# Patient Record
Sex: Female | Born: 1945 | Race: White | Hispanic: No | Marital: Married | Smoking: Former smoker
Health system: Southern US, Community
[De-identification: ages and names within clinical notes are randomized; demographics above are authoritative.]

## PROBLEM LIST (undated history)

## (undated) DIAGNOSIS — G4700 Insomnia, unspecified: Secondary | ICD-10-CM

## (undated) DIAGNOSIS — K219 Gastro-esophageal reflux disease without esophagitis: Secondary | ICD-10-CM

## (undated) DIAGNOSIS — F32A Depression, unspecified: Secondary | ICD-10-CM

## (undated) DIAGNOSIS — E785 Hyperlipidemia, unspecified: Secondary | ICD-10-CM

## (undated) DIAGNOSIS — Z8719 Personal history of other diseases of the digestive system: Secondary | ICD-10-CM

## (undated) DIAGNOSIS — C801 Malignant (primary) neoplasm, unspecified: Secondary | ICD-10-CM

## (undated) DIAGNOSIS — R519 Headache, unspecified: Secondary | ICD-10-CM

## (undated) DIAGNOSIS — M1990 Unspecified osteoarthritis, unspecified site: Secondary | ICD-10-CM

## (undated) DIAGNOSIS — R51 Headache: Secondary | ICD-10-CM

## (undated) DIAGNOSIS — R350 Frequency of micturition: Secondary | ICD-10-CM

## (undated) DIAGNOSIS — F329 Major depressive disorder, single episode, unspecified: Secondary | ICD-10-CM

## (undated) HISTORY — PX: BREAST SURGERY: SHX581

## (undated) HISTORY — PX: EYE SURGERY: SHX253

## (undated) HISTORY — PX: TONSILLECTOMY: SUR1361

## (undated) HISTORY — PX: UPPER GI ENDOSCOPY: SHX6162

## (undated) HISTORY — PX: TUBAL LIGATION: SHX77

## (undated) HISTORY — PX: COLONOSCOPY: SHX174

---

## 1995-07-14 DIAGNOSIS — C801 Malignant (primary) neoplasm, unspecified: Secondary | ICD-10-CM

## 1995-07-14 HISTORY — DX: Malignant (primary) neoplasm, unspecified: C80.1

## 1998-05-07 ENCOUNTER — Ambulatory Visit (HOSPITAL_COMMUNITY): Admission: RE | Admit: 1998-05-07 | Discharge: 1998-05-07 | Payer: Self-pay | Admitting: Obstetrics and Gynecology

## 1998-05-07 ENCOUNTER — Encounter: Payer: Self-pay | Admitting: Obstetrics and Gynecology

## 1998-07-13 HISTORY — PX: CARPAL TUNNEL RELEASE: SHX101

## 1999-05-08 ENCOUNTER — Ambulatory Visit (HOSPITAL_COMMUNITY): Admission: RE | Admit: 1999-05-08 | Discharge: 1999-05-08 | Payer: Self-pay | Admitting: Obstetrics and Gynecology

## 1999-05-12 ENCOUNTER — Ambulatory Visit (HOSPITAL_COMMUNITY): Admission: RE | Admit: 1999-05-12 | Discharge: 1999-05-12 | Payer: Self-pay | Admitting: Obstetrics and Gynecology

## 1999-05-12 ENCOUNTER — Encounter: Payer: Self-pay | Admitting: Obstetrics and Gynecology

## 1999-05-29 ENCOUNTER — Other Ambulatory Visit: Admission: RE | Admit: 1999-05-29 | Discharge: 1999-05-29 | Payer: Self-pay | Admitting: Obstetrics and Gynecology

## 1999-10-22 ENCOUNTER — Encounter: Payer: Self-pay | Admitting: Internal Medicine

## 1999-10-22 ENCOUNTER — Encounter: Admission: RE | Admit: 1999-10-22 | Discharge: 1999-10-22 | Payer: Self-pay | Admitting: Internal Medicine

## 2000-04-06 ENCOUNTER — Ambulatory Visit (HOSPITAL_COMMUNITY): Admission: RE | Admit: 2000-04-06 | Discharge: 2000-04-06 | Payer: Self-pay | Admitting: *Deleted

## 2000-05-13 ENCOUNTER — Encounter: Payer: Self-pay | Admitting: Obstetrics and Gynecology

## 2000-05-13 ENCOUNTER — Ambulatory Visit (HOSPITAL_COMMUNITY): Admission: RE | Admit: 2000-05-13 | Discharge: 2000-05-13 | Payer: Self-pay | Admitting: Obstetrics and Gynecology

## 2000-05-28 ENCOUNTER — Other Ambulatory Visit: Admission: RE | Admit: 2000-05-28 | Discharge: 2000-05-28 | Payer: Self-pay | Admitting: Obstetrics and Gynecology

## 2000-06-07 ENCOUNTER — Encounter: Admission: RE | Admit: 2000-06-07 | Discharge: 2000-06-07 | Payer: Self-pay | Admitting: Obstetrics and Gynecology

## 2000-06-07 ENCOUNTER — Encounter: Payer: Self-pay | Admitting: Obstetrics and Gynecology

## 2000-11-24 ENCOUNTER — Encounter: Admission: RE | Admit: 2000-11-24 | Discharge: 2000-11-24 | Payer: Self-pay | Admitting: Obstetrics and Gynecology

## 2000-11-24 ENCOUNTER — Encounter: Payer: Self-pay | Admitting: Obstetrics and Gynecology

## 2001-02-11 ENCOUNTER — Encounter: Admission: RE | Admit: 2001-02-11 | Discharge: 2001-02-11 | Payer: Self-pay | Admitting: Obstetrics and Gynecology

## 2001-02-11 ENCOUNTER — Encounter: Payer: Self-pay | Admitting: Obstetrics and Gynecology

## 2001-05-16 ENCOUNTER — Encounter: Payer: Self-pay | Admitting: Obstetrics and Gynecology

## 2001-05-16 ENCOUNTER — Ambulatory Visit (HOSPITAL_COMMUNITY): Admission: RE | Admit: 2001-05-16 | Discharge: 2001-05-16 | Payer: Self-pay | Admitting: Obstetrics and Gynecology

## 2001-08-02 ENCOUNTER — Other Ambulatory Visit: Admission: RE | Admit: 2001-08-02 | Discharge: 2001-08-02 | Payer: Self-pay | Admitting: Obstetrics and Gynecology

## 2001-08-15 ENCOUNTER — Encounter: Admission: RE | Admit: 2001-08-15 | Discharge: 2001-08-15 | Payer: Self-pay | Admitting: Obstetrics and Gynecology

## 2001-08-15 ENCOUNTER — Encounter: Payer: Self-pay | Admitting: Obstetrics and Gynecology

## 2002-04-13 ENCOUNTER — Encounter: Payer: Self-pay | Admitting: Obstetrics and Gynecology

## 2002-04-13 ENCOUNTER — Encounter: Admission: RE | Admit: 2002-04-13 | Discharge: 2002-04-13 | Payer: Self-pay | Admitting: Obstetrics and Gynecology

## 2002-05-18 ENCOUNTER — Ambulatory Visit (HOSPITAL_COMMUNITY): Admission: RE | Admit: 2002-05-18 | Discharge: 2002-05-18 | Payer: Self-pay | Admitting: Obstetrics and Gynecology

## 2002-05-18 ENCOUNTER — Encounter: Payer: Self-pay | Admitting: Obstetrics and Gynecology

## 2002-07-05 ENCOUNTER — Encounter: Payer: Self-pay | Admitting: Internal Medicine

## 2002-07-05 ENCOUNTER — Encounter: Admission: RE | Admit: 2002-07-05 | Discharge: 2002-07-05 | Payer: Self-pay | Admitting: Internal Medicine

## 2002-09-01 ENCOUNTER — Other Ambulatory Visit: Admission: RE | Admit: 2002-09-01 | Discharge: 2002-09-01 | Payer: Self-pay | Admitting: Obstetrics and Gynecology

## 2002-10-11 ENCOUNTER — Ambulatory Visit (HOSPITAL_COMMUNITY): Admission: RE | Admit: 2002-10-11 | Discharge: 2002-10-11 | Payer: Self-pay | Admitting: *Deleted

## 2003-05-25 ENCOUNTER — Ambulatory Visit (HOSPITAL_COMMUNITY): Admission: RE | Admit: 2003-05-25 | Discharge: 2003-05-25 | Payer: Self-pay | Admitting: Obstetrics and Gynecology

## 2003-09-26 ENCOUNTER — Other Ambulatory Visit: Admission: RE | Admit: 2003-09-26 | Discharge: 2003-09-26 | Payer: Self-pay | Admitting: Obstetrics and Gynecology

## 2004-06-02 ENCOUNTER — Ambulatory Visit (HOSPITAL_COMMUNITY): Admission: RE | Admit: 2004-06-02 | Discharge: 2004-06-02 | Payer: Self-pay | Admitting: Obstetrics and Gynecology

## 2005-10-09 ENCOUNTER — Encounter: Admission: RE | Admit: 2005-10-09 | Discharge: 2005-10-09 | Payer: Self-pay | Admitting: General Surgery

## 2010-07-13 HISTORY — PX: BACK SURGERY: SHX140

## 2015-06-25 ENCOUNTER — Other Ambulatory Visit (HOSPITAL_COMMUNITY): Payer: Self-pay | Admitting: Neurological Surgery

## 2015-07-02 ENCOUNTER — Encounter (HOSPITAL_COMMUNITY): Payer: Self-pay

## 2015-07-02 ENCOUNTER — Ambulatory Visit (HOSPITAL_COMMUNITY)
Admission: RE | Admit: 2015-07-02 | Discharge: 2015-07-02 | Disposition: A | Discharge status: Home / Self Care | Payer: BLUE CROSS/BLUE SHIELD | Source: Ambulatory Visit | Attending: Neurological Surgery | Admitting: Neurological Surgery

## 2015-07-02 ENCOUNTER — Encounter (HOSPITAL_COMMUNITY)
Admission: RE | Admit: 2015-07-02 | Discharge: 2015-07-02 | Disposition: A | Discharge status: Home / Self Care | Payer: BLUE CROSS/BLUE SHIELD | Source: Ambulatory Visit | Attending: Neurological Surgery | Admitting: Neurological Surgery

## 2015-07-02 DIAGNOSIS — R918 Other nonspecific abnormal finding of lung field: Secondary | ICD-10-CM | POA: Insufficient documentation

## 2015-07-02 DIAGNOSIS — IMO0002 Reserved for concepts with insufficient information to code with codable children: Secondary | ICD-10-CM

## 2015-07-02 DIAGNOSIS — M519 Unspecified thoracic, thoracolumbar and lumbosacral intervertebral disc disorder: Secondary | ICD-10-CM | POA: Diagnosis present

## 2015-07-02 DIAGNOSIS — Z01818 Encounter for other preprocedural examination: Secondary | ICD-10-CM | POA: Insufficient documentation

## 2015-07-02 DIAGNOSIS — Z01812 Encounter for preprocedural laboratory examination: Secondary | ICD-10-CM | POA: Diagnosis not present

## 2015-07-02 DIAGNOSIS — Z01810 Encounter for preprocedural cardiovascular examination: Secondary | ICD-10-CM | POA: Insufficient documentation

## 2015-07-02 HISTORY — DX: Headache, unspecified: R51.9

## 2015-07-02 HISTORY — DX: Unspecified osteoarthritis, unspecified site: M19.90

## 2015-07-02 HISTORY — DX: Headache: R51

## 2015-07-02 HISTORY — DX: Major depressive disorder, single episode, unspecified: F32.9

## 2015-07-02 HISTORY — DX: Depression, unspecified: F32.A

## 2015-07-02 HISTORY — DX: Personal history of other diseases of the digestive system: Z87.19

## 2015-07-02 HISTORY — DX: Malignant (primary) neoplasm, unspecified: C80.1

## 2015-07-02 HISTORY — DX: Gastro-esophageal reflux disease without esophagitis: K21.9

## 2015-07-02 LAB — BASIC METABOLIC PANEL
Anion gap: 7 (ref 5–15)
BUN: 6 mg/dL (ref 6–20)
CALCIUM: 9.4 mg/dL (ref 8.9–10.3)
CO2: 30 mmol/L (ref 22–32)
CREATININE: 0.71 mg/dL (ref 0.44–1.00)
Chloride: 107 mmol/L (ref 101–111)
GFR calc Af Amer: >60 mL/min (ref >60)
GLUCOSE: 105 mg/dL — AB (ref 65–99)
POTASSIUM: 3.6 mmol/L (ref 3.5–5.1)
SODIUM: 144 mmol/L (ref 135–145)

## 2015-07-02 LAB — CBC WITH DIFFERENTIAL/PLATELET
BASOS ABS: 0 10*3/uL (ref 0.0–0.1)
Basophils Relative: 1 %
EOS ABS: 0.1 10*3/uL (ref 0.0–0.7)
EOS PCT: 2 %
HEMATOCRIT: 37.2 % (ref 36.0–46.0)
Hemoglobin: 12.1 g/dL (ref 12.0–15.0)
LYMPHS PCT: 37 %
Lymphs Abs: 1.8 10*3/uL (ref 0.7–4.0)
MCH: 30.3 pg (ref 26.0–34.0)
MCHC: 32.5 g/dL (ref 30.0–36.0)
MCV: 93 fL (ref 78.0–100.0)
MONO ABS: 0.3 10*3/uL (ref 0.1–1.0)
Monocytes Relative: 7 %
Neutro Abs: 2.5 10*3/uL (ref 1.7–7.7)
Neutrophils Relative %: 53 %
Platelets: 211 10*3/uL (ref 150–400)
RBC: 4 MIL/uL (ref 3.87–5.11)
RDW: 13.8 % (ref 11.5–15.5)
WBC: 4.8 10*3/uL (ref 4.0–10.5)

## 2015-07-02 LAB — SURGICAL PCR SCREEN
MRSA, PCR: NEGATIVE
Staphylococcus aureus: NEGATIVE

## 2015-07-02 LAB — PROTIME-INR
INR: 0.88 (ref 0.00–1.49)
Prothrombin Time: 12.1 seconds (ref 11.6–15.2)

## 2015-07-02 NOTE — Progress Notes (Addendum)
Anesthesia Chart Review:  Pt is 69 year old female scheduled for 1 level lumbar laminectomy/ decompression microdiscectomy on 07/05/2015 with Dr. Ronnald Ramp.   PMH includes:  Breast cancer, GERD. Former smoker. BMI 18.   Medications include: lipitor, nexium, zetia, pepcid.   Preoperative labs reviewed.    Chest x-ray 07/02/15 reviewed.  1. Mild patchy bilateral pulmonary infiltrates again noted. 2. Previously identified tiny pulmonary nodules noted on prior CT of 05/27/2015 would be difficult to evaluate by plain film exam. Again follow-up CT is suggested for continued evaluation is noted on prior CT report of 05/27/2015 .  EKG 07/02/15: NSR.   Willeen Cass, FNP-BC [facility] Short Stay Surgical Center/Anesthesiology Phone: [phone] 07/02/2015 4:52 PM  Addendum:   Pt sees Dr. Loletha Carrow with pulmonology at [facility] in [facility], last office visit 05/31/15 for follow up on CT results.  He notes "Nodularity is stable and improving. There is a little patchy area that likely is related to swallowing difficulty. The likelihood of cancer is now pretty low, cannot completely rule out BAC. I don't think she needs antibiotics right now. She does have some tree-in-bud, would like to get a sputum AFB at some point, prior TB QuantiFERON Gold had a low control."   Reviewed case with Dr. Gifford Shave. Given Dr. Gaylan Gerold notes and CXR results above, will request pulmonology clearance for surgery. Notified Lorriane Shire in Dr. Ronnald Ramp' office.   Willeen Cass, FNP-BC [facility] Short Stay Surgical Center/Anesthesiology Phone: [phone] 07/03/2015 3:29 PM  Addendum: Signed note of pulmonary clearance from12/22/16 by Dr. Welford Roche received and placed on chart.  [facility] Short Stay Center/Anesthesiology Phone [phone] 07/04/2015 4:23 PM

## 2015-07-02 NOTE — Pre-Procedure Instructions (Signed)
Catherine Davidson  07/02/2015      [id number] - [place], [address] [place] [address] Phone: [phone] Fax: [phone]    Your procedure is scheduled on 07/05/2015.  Report to [facility] Admitting at 11:00 A.M.  Call this number if you have problems the morning of surgery:  [phone]   Remember:  Do not eat food or drink liquids after midnight. On Thursday   Take these medicines the morning of surgery with A SIP OF WATER :  Nexium, Effexor, (Hydrocodone is ok if needed)   Do not wear jewelry, make-up or nail polish.   Do not wear lotions, powders, or perfumes.  You may wear deodorant.   Do not shave 48 hours prior to surgery.   Do not bring valuables to the hospital.   [facility] is not responsible for any belongings or valuables.  Contacts, dentures or bridgework may not be worn into surgery.  Leave your suitcase in the car.  After surgery it may be brought to your room.  For patients admitted to the hospital, discharge time will be determined by your treatment team.  Patients discharged the day of surgery will not be allowed to drive home.   Name and phone number of your driver:   With spouse   Special instructions:  Special Instructions: [place] - Preparing for Surgery  Before surgery, you can play an important role.  Because skin is not sterile, your skin needs to be as free of germs as possible.  You can reduce the number of germs on you skin by washing with CHG (chlorahexidine gluconate) soap before surgery.  CHG is an antiseptic cleaner which kills germs and bonds with the skin to continue killing germs even after washing.  Please DO NOT use if you have an allergy to CHG or antibacterial soaps.  If your skin becomes reddened/irritated stop using the CHG and inform your nurse when you arrive at Short Stay.  Do not shave (including legs and underarms) for at least 48  hours prior to the first CHG shower.  You may shave your face.  Please follow these instructions carefully:   1.  Shower with CHG Soap the night before surgery and the  morning of Surgery.  2.  If you choose to wash your hair, wash your hair first as usual with your  normal shampoo.  3.  After you shampoo, rinse your hair and body thoroughly to remove the  Shampoo.  4.  Use CHG as you would any other liquid soap.  You can apply chg directly to the skin and wash gently with scrungie or a clean washcloth.  5.  Apply the CHG Soap to your body ONLY FROM THE NECK DOWN.    Do not use on open wounds or open sores.  Avoid contact with your eyes, ears, mouth and genitals (private parts).  Wash genitals (private parts)   with your normal soap.  6.  Wash thoroughly, paying special attention to the area where your surgery will be performed.  7.  Thoroughly rinse your body with warm water from the neck down.  8.  DO NOT shower/wash with your normal soap after using and rinsing off   the CHG Soap.  9.  Pat yourself dry with a clean towel.            10.  Wear clean  pajamas.            11.  Place clean sheets on your bed the night of your first shower and do not sleep with pets.  Day of Surgery  Do not apply any lotions/deodorants the morning of surgery.  Please wear clean clothes to the hospital/surgery center.  Please read over the following fact sheets that you were given. Pain Booklet, Coughing and Deep Breathing, MRSA Information and Surgical Site Infection Prevention

## 2015-07-02 NOTE — Progress Notes (Signed)
Pt. Followed by Dr. Carolan Shiver in [place] for neurology, he prescribes the pain meds & sleep & antidepressant.  Pt. Also sees Dr. Shana Chute for GI, states Dr. Kyra Manges in [place] follows her for PCP.  Pt. Not sure when she had her last ekg, denies any chest concerns; denies stress test, echo, cath. In the past. Faxing request to Dr. Benjamine Mola for last OV & EKG.

## 2015-07-04 MED ORDER — DEXAMETHASONE SODIUM PHOSPHATE 10 MG/ML IJ SOLN
10.0000 mg | INTRAMUSCULAR | Status: DC
  Filled 2015-07-04: qty 1

## 2015-07-04 MED ORDER — CEFAZOLIN SODIUM-DEXTROSE 2-3 GM-% IV SOLR
2.0000 g | INTRAVENOUS | Status: DC
  Filled 2015-07-04: qty 50

## 2015-07-04 NOTE — Progress Notes (Signed)
Left message for Lorriane Shire with Dr. Ronnald Ramp for update on patient's pulmonology clearance.  Called [place] [facility] pulmonology; office had no update for patient.

## 2015-07-05 ENCOUNTER — Ambulatory Visit (HOSPITAL_COMMUNITY)
Admission: RE | Admit: 2015-07-05 | Discharge: 2015-07-06 | Disposition: A | Discharge status: Home / Self Care | Payer: BLUE CROSS/BLUE SHIELD | Source: Ambulatory Visit | Attending: Neurological Surgery | Admitting: Neurological Surgery

## 2015-07-05 ENCOUNTER — Ambulatory Visit (HOSPITAL_COMMUNITY): Payer: BLUE CROSS/BLUE SHIELD

## 2015-07-05 ENCOUNTER — Encounter (HOSPITAL_COMMUNITY): Payer: Self-pay | Admitting: Neurological Surgery

## 2015-07-05 ENCOUNTER — Encounter (HOSPITAL_COMMUNITY)
Admission: RE | Disposition: A | Discharge status: Home / Self Care | Payer: Self-pay | Source: Ambulatory Visit | Attending: Neurological Surgery

## 2015-07-05 ENCOUNTER — Ambulatory Visit (HOSPITAL_COMMUNITY): Payer: BLUE CROSS/BLUE SHIELD | Admitting: Certified Registered Nurse Anesthetist

## 2015-07-05 ENCOUNTER — Ambulatory Visit (HOSPITAL_COMMUNITY): Payer: BLUE CROSS/BLUE SHIELD | Admitting: Emergency Medicine

## 2015-07-05 DIAGNOSIS — Z888 Allergy status to other drugs, medicaments and biological substances status: Secondary | ICD-10-CM | POA: Diagnosis not present

## 2015-07-05 DIAGNOSIS — Z91048 Other nonmedicinal substance allergy status: Secondary | ICD-10-CM | POA: Insufficient documentation

## 2015-07-05 DIAGNOSIS — M5126 Other intervertebral disc displacement, lumbar region: Secondary | ICD-10-CM | POA: Diagnosis not present

## 2015-07-05 DIAGNOSIS — G43909 Migraine, unspecified, not intractable, without status migrainosus: Secondary | ICD-10-CM | POA: Diagnosis not present

## 2015-07-05 DIAGNOSIS — Z882 Allergy status to sulfonamides status: Secondary | ICD-10-CM | POA: Diagnosis not present

## 2015-07-05 DIAGNOSIS — K219 Gastro-esophageal reflux disease without esophagitis: Secondary | ICD-10-CM | POA: Insufficient documentation

## 2015-07-05 DIAGNOSIS — M549 Dorsalgia, unspecified: Secondary | ICD-10-CM

## 2015-07-05 DIAGNOSIS — M4806 Spinal stenosis, lumbar region: Secondary | ICD-10-CM | POA: Insufficient documentation

## 2015-07-05 DIAGNOSIS — M545 Low back pain: Secondary | ICD-10-CM

## 2015-07-05 DIAGNOSIS — F329 Major depressive disorder, single episode, unspecified: Secondary | ICD-10-CM | POA: Diagnosis not present

## 2015-07-05 DIAGNOSIS — M19042 Primary osteoarthritis, left hand: Secondary | ICD-10-CM | POA: Insufficient documentation

## 2015-07-05 DIAGNOSIS — K449 Diaphragmatic hernia without obstruction or gangrene: Secondary | ICD-10-CM | POA: Insufficient documentation

## 2015-07-05 DIAGNOSIS — M19041 Primary osteoarthritis, right hand: Secondary | ICD-10-CM | POA: Diagnosis not present

## 2015-07-05 DIAGNOSIS — Z87891 Personal history of nicotine dependence: Secondary | ICD-10-CM | POA: Diagnosis not present

## 2015-07-05 DIAGNOSIS — Z9889 Other specified postprocedural states: Secondary | ICD-10-CM

## 2015-07-05 HISTORY — PX: LUMBAR LAMINECTOMY/DECOMPRESSION MICRODISCECTOMY: SHX5026

## 2015-07-05 SURGERY — LUMBAR LAMINECTOMY/DECOMPRESSION MICRODISCECTOMY 1 LEVEL
Anesthesia: General | Site: Back | Laterality: Right

## 2015-07-05 MED ORDER — NEOSTIGMINE METHYLSULFATE 10 MG/10ML IV SOLN
INTRAVENOUS | Status: DC | PRN
  Administered 2015-07-05: 3 mg via INTRAVENOUS

## 2015-07-05 MED ORDER — FENTANYL CITRATE (PF) 100 MCG/2ML IJ SOLN
INTRAMUSCULAR | Status: AC
  Filled 2015-07-05: qty 2

## 2015-07-05 MED ORDER — POTASSIUM CHLORIDE IN NACL 20-0.9 MEQ/L-% IV SOLN
INTRAVENOUS | Status: DC
  Administered 2015-07-06: 06:00:00 via INTRAVENOUS
  Filled 2015-07-05 (×3): qty 1000

## 2015-07-05 MED ORDER — MORPHINE SULFATE (PF) 2 MG/ML IV SOLN
1.0000 mg | INTRAVENOUS | Status: DC | PRN
  Administered 2015-07-05: 2 mg via INTRAVENOUS
  Administered 2015-07-06: 4 mg via INTRAVENOUS
  Filled 2015-07-05 (×2): qty 2

## 2015-07-05 MED ORDER — ACETAMINOPHEN 325 MG PO TABS: 650.0000 mg | ORAL_TABLET | ORAL | Status: DC | PRN

## 2015-07-05 MED ORDER — TRAMADOL HCL 50 MG PO TABS
50.0000 mg | ORAL_TABLET | Freq: Four times a day (QID) | ORAL | Status: DC | PRN
Start: 2015-07-05 — End: 2015-07-06

## 2015-07-05 MED ORDER — HEMOSTATIC AGENTS (NO CHARGE) OPTIME
TOPICAL | Status: DC | PRN
  Administered 2015-07-05: 1 via TOPICAL

## 2015-07-05 MED ORDER — SODIUM CHLORIDE 0.9 % IJ SOLN: 3.0000 mL | Freq: Two times a day (BID) | INTRAMUSCULAR | Status: DC

## 2015-07-05 MED ORDER — THROMBIN 5000 UNITS EX SOLR
CUTANEOUS | Status: DC | PRN
  Administered 2015-07-05 (×2): 5000 [IU] via TOPICAL

## 2015-07-05 MED ORDER — ROCURONIUM BROMIDE 100 MG/10ML IV SOLN
INTRAVENOUS | Status: DC | PRN
  Administered 2015-07-05: 30 mg via INTRAVENOUS

## 2015-07-05 MED ORDER — DEXAMETHASONE 4 MG PO TABS
4.0000 mg | ORAL_TABLET | Freq: Four times a day (QID) | ORAL | Status: DC
  Administered 2015-07-06: 4 mg via ORAL
  Filled 2015-07-05: qty 1

## 2015-07-05 MED ORDER — TRAZODONE HCL 100 MG PO TABS
100.0000 mg | ORAL_TABLET | Freq: Every day | ORAL | Status: DC
  Administered 2015-07-05: 100 mg via ORAL
  Filled 2015-07-05: qty 1

## 2015-07-05 MED ORDER — ACETAMINOPHEN 650 MG RE SUPP: 650.0000 mg | RECTAL | Status: DC | PRN

## 2015-07-05 MED ORDER — HYDROCODONE-ACETAMINOPHEN 5-325 MG PO TABS
1.0000 | ORAL_TABLET | ORAL | Status: DC | PRN
  Administered 2015-07-05: 2 via ORAL
  Administered 2015-07-06: 1 via ORAL
  Filled 2015-07-05 (×2): qty 2

## 2015-07-05 MED ORDER — 0.9 % SODIUM CHLORIDE (POUR BTL) OPTIME
TOPICAL | Status: DC | PRN
  Administered 2015-07-05: 1000 mL

## 2015-07-05 MED ORDER — EPHEDRINE SULFATE 50 MG/ML IJ SOLN
INTRAMUSCULAR | Status: DC | PRN
  Administered 2015-07-05: 10 mg via INTRAVENOUS

## 2015-07-05 MED ORDER — PHENOL 1.4 % MT LIQD: 1.0000 | OROMUCOSAL | Status: DC | PRN

## 2015-07-05 MED ORDER — SODIUM CHLORIDE 0.9 % IJ SOLN
3.0000 mL | INTRAMUSCULAR | Status: DC | PRN
Start: 2015-07-05 — End: 2015-07-06

## 2015-07-05 MED ORDER — ONDANSETRON HCL 4 MG/2ML IJ SOLN
4.0000 mg | INTRAMUSCULAR | Status: DC | PRN
Start: 2015-07-05 — End: 2015-07-06

## 2015-07-05 MED ORDER — ONDANSETRON HCL 4 MG/2ML IJ SOLN
4.0000 mg | Freq: Once | INTRAMUSCULAR | Status: AC | PRN
  Administered 2015-07-05: 4 mg via INTRAVENOUS

## 2015-07-05 MED ORDER — VENLAFAXINE HCL ER 75 MG PO CP24: 225.0000 mg | ORAL_CAPSULE | Freq: Every day | ORAL | Status: DC

## 2015-07-05 MED ORDER — SODIUM CHLORIDE 0.9 % IR SOLN
Status: DC | PRN
  Administered 2015-07-05: 500 mL

## 2015-07-05 MED ORDER — DEXAMETHASONE SODIUM PHOSPHATE 4 MG/ML IJ SOLN
4.0000 mg | Freq: Four times a day (QID) | INTRAMUSCULAR | Status: DC
  Administered 2015-07-05 – 2015-07-06 (×3): 4 mg via INTRAVENOUS
  Filled 2015-07-05 (×4): qty 1

## 2015-07-05 MED ORDER — TRAMADOL HCL 50 MG PO TABS: 50.0000 mg | ORAL_TABLET | Freq: Four times a day (QID) | ORAL | Status: DC | PRN

## 2015-07-05 MED ORDER — GLYCOPYRROLATE 0.2 MG/ML IJ SOLN
INTRAMUSCULAR | Status: DC | PRN
  Administered 2015-07-05: 0.4 mg via INTRAVENOUS

## 2015-07-05 MED ORDER — BUPIVACAINE HCL (PF) 0.25 % IJ SOLN
INTRAMUSCULAR | Status: DC | PRN
Start: 2015-07-05 — End: 2015-07-05
  Administered 2015-07-05: 6 mL

## 2015-07-05 MED ORDER — LIDOCAINE HCL (CARDIAC) 20 MG/ML IV SOLN
INTRAVENOUS | Status: AC
  Filled 2015-07-05: qty 5

## 2015-07-05 MED ORDER — SODIUM CHLORIDE 0.9 % IV SOLN: 250.0000 mL | INTRAVENOUS | Status: DC

## 2015-07-05 MED ORDER — PROPOFOL 10 MG/ML IV BOLUS
INTRAVENOUS | Status: DC | PRN
  Administered 2015-07-05: 100 mg via INTRAVENOUS

## 2015-07-05 MED ORDER — LACTATED RINGERS IV SOLN
INTRAVENOUS | Status: DC
  Administered 2015-07-05 (×3): via INTRAVENOUS

## 2015-07-05 MED ORDER — SENNA 8.6 MG PO TABS
1.0000 | ORAL_TABLET | Freq: Two times a day (BID) | ORAL | Status: DC
  Administered 2015-07-05 – 2015-07-06 (×2): 8.6 mg via ORAL
  Filled 2015-07-05 (×2): qty 1

## 2015-07-05 MED ORDER — MIDAZOLAM HCL 2 MG/2ML IJ SOLN
INTRAMUSCULAR | Status: AC
  Filled 2015-07-05: qty 2

## 2015-07-05 MED ORDER — MENTHOL 3 MG MT LOZG: 1.0000 | LOZENGE | OROMUCOSAL | Status: DC | PRN

## 2015-07-05 MED ORDER — FENTANYL CITRATE (PF) 250 MCG/5ML IJ SOLN
INTRAMUSCULAR | Status: AC
  Filled 2015-07-05: qty 5

## 2015-07-05 MED ORDER — PHENYLEPHRINE HCL 10 MG/ML IJ SOLN
INTRAMUSCULAR | Status: DC | PRN
  Administered 2015-07-05: 80 ug via INTRAVENOUS

## 2015-07-05 MED ORDER — TIZANIDINE HCL 2 MG PO TABS
2.0000 mg | ORAL_TABLET | Freq: Three times a day (TID) | ORAL | Status: DC | PRN
  Filled 2015-07-05: qty 1

## 2015-07-05 MED ORDER — LUBIPROSTONE 24 MCG PO CAPS
24.0000 ug | ORAL_CAPSULE | Freq: Two times a day (BID) | ORAL | Status: DC
  Administered 2015-07-06: 24 ug via ORAL
  Filled 2015-07-05: qty 1

## 2015-07-05 MED ORDER — FENTANYL CITRATE (PF) 100 MCG/2ML IJ SOLN
25.0000 ug | INTRAMUSCULAR | Status: DC | PRN
  Administered 2015-07-05: 50 ug via INTRAVENOUS
  Administered 2015-07-05 (×2): 25 ug via INTRAVENOUS

## 2015-07-05 MED ORDER — CEFAZOLIN SODIUM 1-5 GM-% IV SOLN
1.0000 g | Freq: Three times a day (TID) | INTRAVENOUS | Status: AC
  Administered 2015-07-06 (×2): 1 g via INTRAVENOUS
  Filled 2015-07-05 (×3): qty 50

## 2015-07-05 MED ORDER — LIDOCAINE HCL (CARDIAC) 20 MG/ML IV SOLN
INTRAVENOUS | Status: DC | PRN
  Administered 2015-07-05: 60 mg via INTRAVENOUS

## 2015-07-05 MED ORDER — MIDAZOLAM HCL 5 MG/5ML IJ SOLN
INTRAMUSCULAR | Status: DC | PRN
  Administered 2015-07-05 (×2): 1 mg via INTRAVENOUS

## 2015-07-05 MED ORDER — VENLAFAXINE HCL ER 75 MG PO CP24
150.0000 mg | ORAL_CAPSULE | Freq: Three times a day (TID) | ORAL | Status: DC
  Administered 2015-07-05 – 2015-07-06 (×2): 150 mg via ORAL
  Filled 2015-07-05 (×2): qty 2

## 2015-07-05 MED ORDER — FENTANYL CITRATE (PF) 100 MCG/2ML IJ SOLN
INTRAMUSCULAR | Status: DC | PRN
  Administered 2015-07-05 (×3): 50 ug via INTRAVENOUS

## 2015-07-05 SURGICAL SUPPLY — 38 items
BAG DECANTER FOR FLEXI CONT (MISCELLANEOUS) ×3 IMPLANT
BENZOIN TINCTURE PRP APPL 2/3 (GAUZE/BANDAGES/DRESSINGS) ×3 IMPLANT
BUR MATCHSTICK NEURO 3.0 LAGG (BURR) ×3 IMPLANT
CANISTER SUCT 3000ML PPV (MISCELLANEOUS) ×3 IMPLANT
CLOSURE WOUND 1/2 X4 (GAUZE/BANDAGES/DRESSINGS) ×1
DRAPE LAPAROTOMY 100X72X124 (DRAPES) ×3 IMPLANT
DRAPE MICROSCOPE LEICA (MISCELLANEOUS) ×3 IMPLANT
DRAPE POUCH INSTRU U-SHP 10X18 (DRAPES) ×3 IMPLANT
DRAPE SURG 17X23 STRL (DRAPES) ×3 IMPLANT
DRSG OPSITE POSTOP 3X4 (GAUZE/BANDAGES/DRESSINGS) ×3 IMPLANT
DURAPREP 26ML APPLICATOR (WOUND CARE) ×3 IMPLANT
ELECT REM PT RETURN 9FT ADLT (ELECTROSURGICAL) ×3
ELECTRODE REM PT RTRN 9FT ADLT (ELECTROSURGICAL) ×1 IMPLANT
GAUZE SPONGE 4X4 16PLY XRAY LF (GAUZE/BANDAGES/DRESSINGS) IMPLANT
GLOVE BIO SURGEON STRL SZ8 (GLOVE) ×3 IMPLANT
GOWN STRL REUS W/ TWL LRG LVL3 (GOWN DISPOSABLE) IMPLANT
GOWN STRL REUS W/ TWL XL LVL3 (GOWN DISPOSABLE) ×1 IMPLANT
GOWN STRL REUS W/TWL 2XL LVL3 (GOWN DISPOSABLE) IMPLANT
GOWN STRL REUS W/TWL LRG LVL3 (GOWN DISPOSABLE)
GOWN STRL REUS W/TWL XL LVL3 (GOWN DISPOSABLE) ×2
HEMOSTAT POWDER KIT SURGIFOAM (HEMOSTASIS) IMPLANT
KIT BASIN OR (CUSTOM PROCEDURE TRAY) ×3 IMPLANT
KIT ROOM TURNOVER OR (KITS) ×3 IMPLANT
NEEDLE HYPO 25X1 1.5 SAFETY (NEEDLE) ×3 IMPLANT
NEEDLE SPNL 20GX3.5 QUINCKE YW (NEEDLE) IMPLANT
NS IRRIG 1000ML POUR BTL (IV SOLUTION) ×3 IMPLANT
PACK LAMINECTOMY NEURO (CUSTOM PROCEDURE TRAY) ×3 IMPLANT
PAD ARMBOARD 7.5X6 YLW CONV (MISCELLANEOUS) ×9 IMPLANT
RUBBERBAND STERILE (MISCELLANEOUS) ×6 IMPLANT
SPONGE SURGIFOAM ABS GEL SZ50 (HEMOSTASIS) ×3 IMPLANT
STRIP CLOSURE SKIN 1/2X4 (GAUZE/BANDAGES/DRESSINGS) ×2 IMPLANT
SUT VIC AB 0 CT1 18XCR BRD8 (SUTURE) ×1 IMPLANT
SUT VIC AB 0 CT1 8-18 (SUTURE) ×2
SUT VIC AB 2-0 CP2 18 (SUTURE) ×3 IMPLANT
SUT VIC AB 3-0 SH 8-18 (SUTURE) ×3 IMPLANT
TOWEL OR 17X24 6PK STRL BLUE (TOWEL DISPOSABLE) ×3 IMPLANT
TOWEL OR 17X26 10 PK STRL BLUE (TOWEL DISPOSABLE) ×3 IMPLANT
WATER STERILE IRR 1000ML POUR (IV SOLUTION) ×3 IMPLANT

## 2015-07-05 NOTE — Progress Notes (Signed)
Patient ID: Catherine Davidson, female   DOB: January 08, 1946, 69 y.o.   MRN: [id number] Doing well, back sore, no leg pain

## 2015-07-05 NOTE — Anesthesia Procedure Notes (Signed)
Procedure Name: Intubation Date/Time: 07/05/2015 2:05 PM Performed by: Rebekah Chesterfield L Pre-anesthesia Checklist: Patient identified, Emergency Drugs available, Suction available, Timeout performed and Patient being monitored Patient Re-evaluated:Patient Re-evaluated prior to inductionOxygen Delivery Method: Circle system utilized Preoxygenation: Pre-oxygenation with 100% oxygen Intubation Type: IV induction Ventilation: Mask ventilation without difficulty Laryngoscope Size: Mac and 3 Grade View: Grade II Tube type: Oral Tube size: 7.0 mm Number of attempts: 1 Airway Equipment and Method: Stylet Placement Confirmation: ETT inserted through vocal cords under direct vision,  breath sounds checked- equal and bilateral and positive ETCO2 Secured at: 20 cm Tube secured with: Tape Dental Injury: Teeth and Oropharynx as per pre-operative assessment

## 2015-07-05 NOTE — H&P (Signed)
Subjective: Patient is a 69 y.o. female admitted for HNP. Onset of symptoms was several months ago, gradually worsening since that time.  The pain is rated severe, and is located at the across the lower back and radiates to R hip. The pain is described as aching and occurs all day. The symptoms have been progressive. Symptoms are exacerbated by exercise. MRI or CT showed HNP/ stenosis L1-2   Past Medical History  Diagnosis Date  . Depression   . GERD (gastroesophageal reflux disease)   . History of hiatal hernia   . Headache     h/o migraines   . Arthritis     lumbar- HNP, OA- hands, & "all of my back"  . Cancer ([place]) 1997    Breast - L- lumpectomy , treated /w radiation    Past Surgical History  Procedure Laterality Date  . Upper gi endoscopy      to stretch esophagus   . Breast surgery Left     lumpectomy  . Back surgery  2012    [facility]- Dr. Kela Millin   . Carpal tunnel release Right 2000  . Tonsillectomy    . Tubal ligation    . Eye surgery Bilateral     cataracts removed    Prior to Admission medications   Medication Sig Start Date End Date Taking? Authorizing Provider  atorvastatin (LIPITOR) 10 MG tablet Take 10 mg by mouth at bedtime.    Yes Historical Provider, MD  b complex vitamins tablet Take 1 tablet by mouth daily.   Yes Historical Provider, MD  Cholecalciferol (VITAMIN D3) 5000 UNITS TABS Take 1 tablet by mouth daily.   Yes Historical Provider, MD  docusate sodium (COLACE) 100 MG capsule Take 100 mg by mouth 3 (three) times daily as needed for mild constipation.   Yes Historical Provider, MD  esomeprazole (NEXIUM) 40 MG capsule Take 40 mg by mouth 2 (two) times daily before a meal.   Yes Historical Provider, MD  ezetimibe (ZETIA) 10 MG tablet Take 10 mg by mouth at bedtime.    Yes Historical Provider, MD  famotidine-calcium carbonate-magnesium hydroxide (PEPCID COMPLETE) 10-800-165 MG chewable tablet Chew 1 tablet by mouth daily as needed.   Yes Historical Provider, MD   HYDROcodone-acetaminophen (NORCO) 10-325 MG tablet Take 2 tablets by mouth every 8 (eight) hours.    Yes Historical Provider, MD  isometheptene-acetaminophen-dichloralphenazone (MIDRIN) 65-100-325 MG capsule Take 1 capsule by mouth 4 (four) times daily as needed for migraine. Maximum 5 capsules in 12 hours for migraine headaches, 8 capsules in 24 hours for tension headaches.   Yes Historical Provider, MD  lidocaine (LIDODERM) 5 % Place 1 patch onto the skin daily as needed. Remove & Discard patch within 12 hours or as directed by MD   Yes Historical Provider, MD  lubiprostone (AMITIZA) 24 MCG capsule Take 24 mcg by mouth 2 (two) times daily with a meal.   Yes Historical Provider, MD  ondansetron (ZOFRAN-ODT) 4 MG disintegrating tablet Take 1-2 tablets by mouth every 8 (eight) hours as needed. 04/17/15  Yes Historical Provider, MD  polyethylene glycol (MIRALAX / GLYCOLAX) packet Take 17-34 g by mouth daily.   Yes Historical Provider, MD  sennosides-docusate sodium (SENOKOT-S) 8.6-50 MG tablet Take 2-4 tablets by mouth daily.   Yes Historical Provider, MD  tiZANidine (ZANAFLEX) 2 MG tablet Take 2 mg by mouth every 8 (eight) hours as needed for muscle spasms.   Yes Historical Provider, MD  traZODone (DESYREL) 100 MG tablet Take 100 mg  by mouth at bedtime.   Yes Historical Provider, MD  venlafaxine XR (EFFEXOR-XR) 150 MG 24 hr capsule Take 450 mg by mouth at bedtime.    Yes Historical Provider, MD  traMADol (ULTRAM) 50 MG tablet Take 50 mg by mouth every 6 (six) hours as needed for moderate pain.    Historical Provider, MD   Allergies  Allergen Reactions  . Nickel Other (See Comments)    Turns skin black  . Sulfa Antibiotics Hives  . Nitrofuran Derivatives Hives    Social History  Substance Use Topics  . Smoking status: Former Research scientist (life sciences)  . Smokeless tobacco: Former Systems developer    Quit date: 07/01/1994  . Alcohol Use: No    History reviewed. No pertinent family history.   Review of Systems  Positive  ROS: neg  All other systems have been reviewed and were otherwise negative with the exception of those mentioned in the HPI and as above.  Objective: Vital signs in last 24 hours: Temp:  [98.6 F (37 C)] 98.6 F (37 C) (12/23 1108) Pulse Rate:  [106] 106 (12/23 1108) Resp:  [16] 16 (12/23 1108) BP: (121)/(81) 121/81 mmHg (12/23 1108) SpO2:  [99 %] 99 % (12/23 1108) Weight:  [43.545 kg (96 lb)] 43.545 kg (96 lb) (12/23 1108)  General Appearance: Alert, cooperative, no distress, appears stated age Head: Normocephalic, without obvious abnormality, atraumatic Eyes: PERRL, conjunctiva/corneas clear, EOM's intact    Neck: Supple, symmetrical, trachea midline Back: Symmetric, no curvature, ROM normal, no CVA tenderness Lungs:  respirations unlabored Heart: Regular rate and rhythm Abdomen: Soft, non-tender Extremities: Extremities normal, atraumatic, no cyanosis or edema Pulses: 2+ and symmetric all extremities Skin: Skin color, texture, turgor normal, no rashes or lesions  NEUROLOGIC:   Mental status: Alert and oriented x4,  no aphasia, good attention span, fund of knowledge, and memory Motor Exam - grossly normal Sensory Exam - grossly normal Reflexes: 1= Coordination - grossly normal Gait - grossly normal Balance - grossly normal Cranial Nerves: I: smell Not tested  II: visual acuity  OS: nl    OD: nl  II: visual fields Full to confrontation  II: pupils Equal, round, reactive to light  III,VII: ptosis None  III,IV,VI: extraocular muscles  Full ROM  V: mastication Normal  V: facial light touch sensation  Normal  V,VII: corneal reflex  Present  VII: facial muscle function - upper  Normal  VII: facial muscle function - lower Normal  VIII: hearing Not tested  IX: soft palate elevation  Normal  IX,X: gag reflex Present  XI: trapezius strength  5/5  XI: sternocleidomastoid strength 5/5  XI: neck flexion strength  5/5  XII: tongue strength  Normal    Data Review Lab  Results  Component Value Date   WBC 4.8 07/02/2015   HGB 12.1 07/02/2015   HCT 37.2 07/02/2015   MCV 93.0 07/02/2015   PLT 211 07/02/2015   Lab Results  Component Value Date   NA 144 07/02/2015   K 3.6 07/02/2015   CL 107 07/02/2015   CO2 30 07/02/2015   BUN 6 07/02/2015   CREATININE 0.71 07/02/2015   GLUCOSE 105* 07/02/2015   Lab Results  Component Value Date   INR 0.88 07/02/2015    Assessment/Plan: Patient admitted for R L1-2 LL. Patient has failed a reasonable attempt at conservative therapy.  I explained the condition and procedure to the patient and answered any questions.  Patient wishes to proceed with procedure as planned. Understands risks/ benefits and typical  outcomes of procedure.   Khaliah Barnick S 07/05/2015 1:41 PM

## 2015-07-05 NOTE — Anesthesia Postprocedure Evaluation (Signed)
Anesthesia Post Note  Patient: Catherine Davidson  Procedure(s) Performed: Procedure(s) (LRB): LUMBAR LAMINECTOMY/DECOMPRESSION MICRODISCECTOMY 1 LEVEL (Right)  Patient location during evaluation: PACU Anesthesia Type: General Level of consciousness: awake and alert Pain management: pain level controlled Vital Signs Assessment: post-procedure vital signs reviewed and stable Respiratory status: spontaneous breathing, nonlabored ventilation, respiratory function stable and patient connected to nasal cannula oxygen Cardiovascular status: blood pressure returned to baseline and stable Postop Assessment: no signs of nausea or vomiting Anesthetic complications: no    Last Vitals:  Filed Vitals:   07/05/15 1630 07/05/15 1633  BP:    Pulse: 87 74  Temp:  37 C  Resp: 17 15    Last Pain:  Filed Vitals:   07/05/15 1635  PainSc: Asleep                 Hadriel Northup,W. EDMOND

## 2015-07-05 NOTE — Transfer of Care (Signed)
Immediate Anesthesia Transfer of Care Note  Patient: Catherine Davidson  Procedure(s) Performed: Procedure(s) with comments: LUMBAR LAMINECTOMY/DECOMPRESSION MICRODISCECTOMY 1 LEVEL (Right) - LUMBAR LAMINECTOMY/DECOMPRESSION MICRODISCECTOMY 1 LEVEL  Patient Location: PACU  Anesthesia Type:General  Level of Consciousness: awake, alert , oriented and patient cooperative  Airway & Oxygen Therapy: Patient Spontanous Breathing and Patient connected to nasal cannula oxygen  Post-op Assessment: Report given to RN, Post -op Vital signs reviewed and stable and Patient moving all extremities  Post vital signs: Reviewed and stable  Last Vitals:  Filed Vitals:   07/05/15 1108  BP: 121/81  Pulse: 106  Temp: 37 C  Resp: 16    Complications: No apparent anesthesia complications

## 2015-07-05 NOTE — Anesthesia Preprocedure Evaluation (Signed)
Anesthesia Evaluation  Patient identified by MRN, date of birth, ID band Patient awake    Reviewed: Allergy & Precautions, NPO status , Patient's Chart, lab work & pertinent test results  Airway Mallampati: II  TM Distance: >3 FB Neck ROM: Full    Dental  (+) Teeth Intact, Dental Advisory Given   Pulmonary former smoker,    Pulmonary exam normal breath sounds clear to auscultation       Cardiovascular Exercise Tolerance: Good (-) hypertension(-) angina(-) CAD and (-) Past MI negative cardio ROS Normal cardiovascular exam Rhythm:Regular Rate:Normal     Neuro/Psych  Headaches, PSYCHIATRIC DISORDERS Depression    GI/Hepatic Neg liver ROS, hiatal hernia, GERD  Medicated,  Endo/Other  negative endocrine ROS  Renal/GU negative Renal ROS     Musculoskeletal  (+) Arthritis , Osteoarthritis,    Abdominal   Peds  Hematology negative hematology ROS (+)   Anesthesia Other Findings Day of surgery medications reviewed with the patient.  Breast ca s/p left breast lumpectomy  Reproductive/Obstetrics                             Anesthesia Physical  Anesthesia Plan  ASA: II  Anesthesia Plan: General   Post-op Pain Management:    Induction: Intravenous  Airway Management Planned: Oral ETT  Additional Equipment:   Intra-op Plan:   Post-operative Plan: Extubation in OR  Informed Consent: I have reviewed the patients History and Physical, chart, labs and discussed the procedure including the risks, benefits and alternatives for the proposed anesthesia with the patient or authorized representative who has indicated his/her understanding and acceptance.   Dental advisory given  Plan Discussed with: CRNA  Anesthesia Plan Comments: (Risks/benefits of general anesthesia discussed with patient including risk of damage to teeth, lips, gum, and tongue, nausea/vomiting, allergic reactions to  medications, and the possibility of heart attack, stroke and death.  All patient questions answered.  Patient wishes to proceed.)        Anesthesia Quick Evaluation  

## 2015-07-05 NOTE — Op Note (Signed)
07/05/2015  3:20 PM  PATIENT:  Catherine Davidson  69 y.o. 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 She had an MRI which showed scoliosis with severe foraminal stenosis at L1-2 on the right with a right foraminal disc herniation. She tried medical management without relief. I recommended a right L1-2 extra foraminal microdiscectomy. Patient understood the risks, benefits, and alternatives and potential outcomes and wished to proceed.  PROCEDURE DETAILS: The patient was taken to the operating room and after induction of adequate generalized endotracheal anesthesia, the patient was rolled into the prone position on the Wilson frame and all pressure points were padded. The lumbar region was cleaned and then prepped with DuraPrep and draped in the usual sterile fashion. 5 cc of local anesthesia was injected and then a dorsal midline incision was made and carried down to the lumbo sacral fascia. The fascia was opened and the paraspinous musculature was taken down in a subperiosteal fashion to expose L12 on the right. Intraoperative x-ray confirmed my level, and then I used a combination of the high-speed drill and the Kerrison punches to perform an extraforaminal decompression at L1 2 on the right. I drilled away the superior part of the facet as well as a little bit of the lateral part of the pars. There was significant compression from the superior facet of L2. The underlying yellow ligament was opened and removed in a piecemeal fashion to expose the underlying  dura and exiting nerve root. I coagulated the epidural venous vasculature, and incised the disc space. I performed a thorough intradiscal discectomy with pituitary rongeurs and curettes, until I had a nice decompression of the nerve root and the midline. I then palpated with a coronary dilator along the nerve root and into the foramen to assure adequate decompression. I felt no more compression of the nerve root. I irrigated with saline solution containing bacitracin. Achieved hemostasis with bipolar cautery, lined the dura with Gelfoam, and then closed the fascia with 0 Vicryl. I closed the subcutaneous tissues with 2-0 Vicryl and the subcuticular tissues with 3-0 Vicryl. The skin was then closed with benzoin and Steri-Strips. The drapes were removed, a sterile dressing was applied. The patient was awakened from general anesthesia and transferred to the recovery room in stable condition. At the end of the procedure all sponge, needle and instrument counts were correct.   PLAN OF CARE: Admit for overnight observation  PATIENT DISPOSITION:  PACU - hemodynamically stable.   Delay start of Pharmacological VTE agent (>24hrs) due to surgical blood loss or risk of bleeding:  yes

## 2015-07-06 DIAGNOSIS — M5126 Other intervertebral disc displacement, lumbar region: Secondary | ICD-10-CM | POA: Diagnosis not present

## 2015-07-06 MED ORDER — ONDANSETRON 4 MG PO TBDP
4.0000 mg | ORAL_TABLET | Freq: Once | ORAL | Status: AC
  Administered 2015-07-06: 4 mg via ORAL
  Filled 2015-07-06: qty 1

## 2015-07-06 MED ORDER — OXYCODONE-ACETAMINOPHEN 5-325 MG PO TABS: 1.0000 | ORAL_TABLET | ORAL | Status: DC | PRN

## 2015-07-06 NOTE — Progress Notes (Signed)
Received from PACU via stretcher; oriented patient to room and unit routine.

## 2015-07-06 NOTE — Progress Notes (Signed)
Pt discharging at this time taking all personal belongings. IV discontinued, dry dressing applied. Surgical site dry and intact. Discharge instructions with prescription provided with verbal understanding. No noted distress.

## 2015-07-06 NOTE — Progress Notes (Signed)
Pt ambulated with rolling walker in the hallway stand by assist. Gait steady. No noted distress. Will continue to monitor.

## 2015-07-06 NOTE — Discharge Summary (Signed)
Physician Discharge Summary  Patient ID: Catherine Davidson MRN: [id number] DOB/AGE: 67-Jun-1947 67 y.o.  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 Ronnald Ramp who performed a lumbar discectomy. Postoperatively she is up and ambulating. She is asking to be discharged to home. She has been given instructions regarding wound care and activities. She is scheduled to follow-up with Dr. Ronnald Ramp in about a week and a half.  Discharge Exam: Blood pressure 109/58, pulse 78, temperature 98 F (36.7 C), temperature source Oral, resp. rate 20, height 5\' 1"  (1.549 m), weight 43.545 kg (96 lb), SpO2 100 %.  Disposition: Home    Medication List    TAKE these medications        atorvastatin 10 MG tablet  Commonly known as:  LIPITOR  Take 10 mg by mouth at bedtime.     b complex vitamins tablet  Take 1 tablet by mouth daily.     docusate sodium 100 MG capsule  Commonly known as:  COLACE  Take 100 mg by mouth 3 (three) times daily as needed for mild constipation.     esomeprazole 40 MG capsule  Commonly known as:  NEXIUM  Take 40 mg by mouth 2 (two) times daily before a meal.     ezetimibe 10 MG tablet  Commonly known as:  ZETIA  Take 10 mg by mouth at bedtime.     famotidine-calcium carbonate-magnesium hydroxide 10-800-165 MG chewable tablet  Commonly known as:  PEPCID COMPLETE  Chew 1 tablet by mouth daily as needed.     HYDROcodone-acetaminophen 10-325 MG tablet  Commonly known as:  NORCO  Take 2 tablets by mouth every 8 (eight) hours.     isometheptene-acetaminophen-dichloralphenazone 65-100-325 MG capsule  Commonly known as:  MIDRIN  Take 1 capsule by mouth 4 (four) times daily as needed for migraine. Maximum 5 capsules  in 12 hours for migraine headaches, 8 capsules in 24 hours for tension headaches.     lidocaine 5 %  Commonly known as:  LIDODERM  Place 1 patch onto the skin daily as needed. Remove & Discard patch within 12 hours or as directed by MD     lubiprostone 24 MCG capsule  Commonly known as:  AMITIZA  Take 24 mcg by mouth 2 (two) times daily with a meal.     ondansetron 4 MG disintegrating tablet  Commonly known as:  ZOFRAN-ODT  Take 1-2 tablets by mouth every 8 (eight) hours as needed.     oxyCODONE-acetaminophen 5-325 MG tablet  Commonly known as:  ROXICET  Take 1-2 tablets by mouth every 4 (four) hours as needed (pain).     polyethylene glycol packet  Commonly known as:  MIRALAX / GLYCOLAX  Take 17-34 g by mouth daily.     sennosides-docusate sodium 8.6-50 MG tablet  Commonly known as:  SENOKOT-S  Take 2-4 tablets by mouth daily.     tiZANidine 2 MG tablet  Commonly known as:  ZANAFLEX  Take 2 mg by mouth every 8 (eight) hours as needed for muscle spasms.     traMADol 50 MG tablet  Commonly known as:  ULTRAM  Take 50 mg by mouth every 6 (six) hours as needed for moderate pain.     traZODone 100 MG tablet  Commonly known as:  DESYREL  Take 100 mg by mouth at bedtime.     venlafaxine XR 150 MG 24 hr capsule  Commonly known as:  EFFEXOR-XR  Take 450 mg by mouth at bedtime. Pt states she takes 450 mg qHS, but Rx written as 150 mg TID     Vitamin D3 5000 UNITS Tabs  Take 1 tablet by mouth daily.         SignedHosie Spangle 07/06/2015, 12:44 PM

## 2015-07-09 ENCOUNTER — Encounter (HOSPITAL_COMMUNITY): Payer: Self-pay | Admitting: Neurological Surgery

## 2016-03-30 ENCOUNTER — Other Ambulatory Visit: Payer: Self-pay | Admitting: Neurological Surgery

## 2016-04-20 ENCOUNTER — Encounter (HOSPITAL_COMMUNITY)
Admission: RE | Admit: 2016-04-20 | Discharge: 2016-04-20 | Disposition: A | Discharge status: Home / Self Care | Payer: BLUE CROSS/BLUE SHIELD | Source: Ambulatory Visit | Attending: Neurological Surgery | Admitting: Neurological Surgery

## 2016-04-20 ENCOUNTER — Other Ambulatory Visit (HOSPITAL_COMMUNITY): Payer: Self-pay | Admitting: *Deleted

## 2016-04-20 ENCOUNTER — Encounter (HOSPITAL_COMMUNITY): Payer: Self-pay

## 2016-04-20 DIAGNOSIS — Z01812 Encounter for preprocedural laboratory examination: Secondary | ICD-10-CM | POA: Diagnosis present

## 2016-04-20 HISTORY — DX: Frequency of micturition: R35.0

## 2016-04-20 HISTORY — DX: Hyperlipidemia, unspecified: E78.5

## 2016-04-20 HISTORY — DX: Insomnia, unspecified: G47.00

## 2016-04-20 LAB — CBC
HCT: 34.2 % — ABNORMAL LOW (ref 36.0–46.0)
HEMOGLOBIN: 10.6 g/dL — AB (ref 12.0–15.0)
MCH: 28.8 pg (ref 26.0–34.0)
MCHC: 31 g/dL (ref 30.0–36.0)
MCV: 92.9 fL (ref 78.0–100.0)
Platelets: 278 10*3/uL (ref 150–400)
RBC: 3.68 MIL/uL — ABNORMAL LOW (ref 3.87–5.11)
RDW: 13.1 % (ref 11.5–15.5)
WBC: 6.7 10*3/uL (ref 4.0–10.5)

## 2016-04-20 LAB — SURGICAL PCR SCREEN
MRSA, PCR: NEGATIVE
STAPHYLOCOCCUS AUREUS: NEGATIVE

## 2016-04-20 NOTE — Pre-Procedure Instructions (Signed)
    Catherine Davidson  04/20/2016      [phone] - [place], [address] [place] [address] Phone: [phone] Fax: [phone]    Your procedure is scheduled on 04-24-2016   Friday   Report to [facility] Admitting at 7:45 A.M.   Call this number if you have problems the morning of surgery:  [phone]   Remember:  Do not eat food or drink liquids after midnight.   Take these medicines the morning of surgery with A SIP OF WATER Nexium,Pain medication if needed, Tizanidine(Zanaflex)                STOP ASPIRIN,ANTIINFLAMATORIES (IBUPROFEN,ALEVE,MOTRIN,ADVIL,GOODY'S POWDERS),HERBAL SUPPLEMENTS,FISH OIL,AND VITAMINS 5-7 DAYS PRIOR TO SURGERY   Do not wear jewelry, make-up or nail polish.  Do not wear lotions, powders, or perfumes, or deoderant.  Do not shave 48 hours prior to surgery.     Do not bring valuables to the hospital.  [facility] is not responsible for any belongings or valuables.  Contacts, dentures or bridgework may not be worn into surgery.  Leave your suitcase in the car.  After surgery it may be brought to your room.  For patients admitted to the hospital, discharge time will be determined by your treatment team.  Patients discharged the day of surgery will not be allowed to drive home.    Special instructions:  See attached Sheet for instructions on CHG showers  .

## 2016-04-21 NOTE — Progress Notes (Signed)
Anesthesia Chart Review: Patient is a 70 year old female scheduled for spinal cord stimulator trial on 04/24/16 by Dr. Ronnald Ramp.   PMH includes former smoker, breast cancer s/p left lumpectomy/radiation, hyperlipidemia, GERD, hiatal hernia, migraines, arthritis, insomnia, L1-2 microdiscectomy 07/05/15.   - PCP is Dr. Thalia Bloodgood.  - Pulmonologist is Dr. Loletha Carrow with [facility], last visit 08/16/15. He reviewed her 08/14/15 chest CT results and wrote, "She has had RLL infilt and nodularity which is much improved on current CT. 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 I don't think she needs Abxs. See Dr. Shana Chute about her severe reflux and other GI issues."  Medications include Lipitor, Nexium, Pepcid, Prozac, Norco, Lidoderm, Remeron, Zofran, Zanaflex, trazodone.  BP (!) 116/47   Pulse 66   Temp 36.7 C   Resp 18   Ht 5' 1.5" (1.562 m)   Wt 96 lb 6.4 oz (43.7 kg)   SpO2 98%   BMI 17.92 kg/m   EKG 07/02/15: NSR.   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 No evidence of lymphadenopathy or pleural effusion.  CXR 07/02/15: Impression:  1. Mild patchy bilateral pulmonary infiltrates again noted. 2. Previously identified tiny pulmonary nodules noted on prior CT of 05/27/2015 would be difficult to evaluate by plain film exam. Again follow-up CT is suggested for continued evaluation is noted on prior CT report of 05/27/2015.  Preoperative CBC noted. H/H 10.6/34.2, this is down from her pre-op back surgery H/H in 06/2015. She will need on-going follow-up with her PCP.  If no acute changes then I would anticipate that she can proceed as  planned.  George Hugh [facility]/Anesthesiology Phone [phone] 04/21/2016 11:45 AM

## 2016-04-23 MED ORDER — CEFAZOLIN SODIUM-DEXTROSE 2-4 GM/100ML-% IV SOLN
2.0000 g | INTRAVENOUS | Status: AC
  Administered 2016-04-24: 2 g via INTRAVENOUS
  Filled 2016-04-23: qty 100

## 2016-04-24 ENCOUNTER — Encounter (HOSPITAL_COMMUNITY): Payer: Self-pay | Admitting: Critical Care Medicine

## 2016-04-24 ENCOUNTER — Encounter (HOSPITAL_COMMUNITY)
Admission: RE | Disposition: A | Discharge status: Home / Self Care | Payer: Self-pay | Source: Ambulatory Visit | Attending: Neurological Surgery

## 2016-04-24 ENCOUNTER — Ambulatory Visit (HOSPITAL_COMMUNITY): Payer: BLUE CROSS/BLUE SHIELD | Admitting: Anesthesiology

## 2016-04-24 ENCOUNTER — Ambulatory Visit (HOSPITAL_COMMUNITY): Payer: BLUE CROSS/BLUE SHIELD

## 2016-04-24 ENCOUNTER — Ambulatory Visit (HOSPITAL_COMMUNITY)
Admission: RE | Admit: 2016-04-24 | Discharge: 2016-04-25 | Disposition: A | Discharge status: Home / Self Care | Payer: BLUE CROSS/BLUE SHIELD | Source: Ambulatory Visit | Attending: Neurological Surgery | Admitting: Neurological Surgery

## 2016-04-24 DIAGNOSIS — M4310 Spondylolisthesis, site unspecified: Secondary | ICD-10-CM | POA: Insufficient documentation

## 2016-04-24 DIAGNOSIS — M19042 Primary osteoarthritis, left hand: Secondary | ICD-10-CM | POA: Diagnosis not present

## 2016-04-24 DIAGNOSIS — K449 Diaphragmatic hernia without obstruction or gangrene: Secondary | ICD-10-CM | POA: Diagnosis not present

## 2016-04-24 DIAGNOSIS — Z853 Personal history of malignant neoplasm of breast: Secondary | ICD-10-CM | POA: Diagnosis not present

## 2016-04-24 DIAGNOSIS — M961 Postlaminectomy syndrome, not elsewhere classified: Secondary | ICD-10-CM | POA: Insufficient documentation

## 2016-04-24 DIAGNOSIS — M19041 Primary osteoarthritis, right hand: Secondary | ICD-10-CM | POA: Insufficient documentation

## 2016-04-24 DIAGNOSIS — Z87891 Personal history of nicotine dependence: Secondary | ICD-10-CM | POA: Diagnosis not present

## 2016-04-24 DIAGNOSIS — K219 Gastro-esophageal reflux disease without esophagitis: Secondary | ICD-10-CM | POA: Insufficient documentation

## 2016-04-24 DIAGNOSIS — F329 Major depressive disorder, single episode, unspecified: Secondary | ICD-10-CM | POA: Diagnosis not present

## 2016-04-24 DIAGNOSIS — Z9689 Presence of other specified functional implants: Secondary | ICD-10-CM

## 2016-04-24 DIAGNOSIS — Z888 Allergy status to other drugs, medicaments and biological substances status: Secondary | ICD-10-CM | POA: Insufficient documentation

## 2016-04-24 DIAGNOSIS — Z9889 Other specified postprocedural states: Secondary | ICD-10-CM

## 2016-04-24 DIAGNOSIS — Z419 Encounter for procedure for purposes other than remedying health state, unspecified: Secondary | ICD-10-CM

## 2016-04-24 DIAGNOSIS — Z882 Allergy status to sulfonamides status: Secondary | ICD-10-CM | POA: Diagnosis not present

## 2016-04-24 DIAGNOSIS — M419 Scoliosis, unspecified: Secondary | ICD-10-CM | POA: Diagnosis present

## 2016-04-24 HISTORY — PX: SPINAL CORD STIMULATOR TRIAL: SHX5380

## 2016-04-24 SURGERY — LUMBAR SPINAL CORD STIMULATOR TRIAL
Anesthesia: General

## 2016-04-24 MED ORDER — ACETAMINOPHEN 325 MG PO TABS
650.0000 mg | ORAL_TABLET | ORAL | Status: DC | PRN
  Administered 2016-04-25: 650 mg via ORAL
  Filled 2016-04-24: qty 2

## 2016-04-24 MED ORDER — SUCCINYLCHOLINE CHLORIDE 200 MG/10ML IV SOSY
PREFILLED_SYRINGE | INTRAVENOUS | Status: AC
  Filled 2016-04-24: qty 10

## 2016-04-24 MED ORDER — CEFAZOLIN IN D5W 1 GM/50ML IV SOLN
1.0000 g | Freq: Three times a day (TID) | INTRAVENOUS | Status: AC
  Administered 2016-04-24 – 2016-04-25 (×2): 1 g via INTRAVENOUS
  Filled 2016-04-24 (×2): qty 50

## 2016-04-24 MED ORDER — EPHEDRINE SULFATE-NACL 50-0.9 MG/10ML-% IV SOSY
PREFILLED_SYRINGE | INTRAVENOUS | Status: DC | PRN
Start: 2016-04-24 — End: 2016-04-24
  Administered 2016-04-24: 5 mg via INTRAVENOUS

## 2016-04-24 MED ORDER — VANCOMYCIN HCL 1000 MG IV SOLR
INTRAVENOUS | Status: DC | PRN
  Administered 2016-04-24: 1000 mg via TOPICAL

## 2016-04-24 MED ORDER — HYDROCODONE-ACETAMINOPHEN 10-325 MG PO TABS
2.0000 | ORAL_TABLET | Freq: Three times a day (TID) | ORAL | Status: DC
  Administered 2016-04-24 – 2016-04-25 (×2): 2 via ORAL
  Filled 2016-04-24 (×3): qty 2

## 2016-04-24 MED ORDER — FENTANYL CITRATE (PF) 100 MCG/2ML IJ SOLN
INTRAMUSCULAR | Status: AC
Start: 2016-04-24 — End: 2016-04-25
  Filled 2016-04-24: qty 2

## 2016-04-24 MED ORDER — SODIUM CHLORIDE 0.9 % IV SOLN: 250.0000 mL | INTRAVENOUS | Status: DC

## 2016-04-24 MED ORDER — CHLORHEXIDINE GLUCONATE CLOTH 2 % EX PADS: 6.0000 | MEDICATED_PAD | Freq: Once | CUTANEOUS | Status: DC

## 2016-04-24 MED ORDER — LIDOCAINE 2% (20 MG/ML) 5 ML SYRINGE
INTRAMUSCULAR | Status: AC
Start: 2016-04-24 — End: 2016-04-24
  Filled 2016-04-24: qty 5

## 2016-04-24 MED ORDER — ONDANSETRON HCL 4 MG/2ML IJ SOLN
INTRAMUSCULAR | Status: AC
  Filled 2016-04-24: qty 2

## 2016-04-24 MED ORDER — LACTATED RINGERS IV SOLN
INTRAVENOUS | Status: DC
  Administered 2016-04-24: 10:00:00 via INTRAVENOUS

## 2016-04-24 MED ORDER — POTASSIUM CHLORIDE IN NACL 20-0.9 MEQ/L-% IV SOLN
INTRAVENOUS | Status: DC
Start: 2016-04-24 — End: 2016-04-25
  Filled 2016-04-24: qty 1000

## 2016-04-24 MED ORDER — FLUOXETINE HCL 20 MG PO CAPS
40.0000 mg | ORAL_CAPSULE | Freq: Every evening | ORAL | Status: DC
  Administered 2016-04-24: 40 mg via ORAL
  Filled 2016-04-24: qty 2

## 2016-04-24 MED ORDER — SODIUM CHLORIDE 0.9% FLUSH: 3.0000 mL | INTRAVENOUS | Status: DC | PRN

## 2016-04-24 MED ORDER — LIDOCAINE 2% (20 MG/ML) 5 ML SYRINGE
INTRAMUSCULAR | Status: AC
  Filled 2016-04-24: qty 5

## 2016-04-24 MED ORDER — LIDOCAINE HCL (CARDIAC) 20 MG/ML IV SOLN
INTRAVENOUS | Status: DC | PRN
  Administered 2016-04-24: 60 mg via INTRAVENOUS

## 2016-04-24 MED ORDER — MENTHOL 3 MG MT LOZG: 1.0000 | LOZENGE | OROMUCOSAL | Status: DC | PRN

## 2016-04-24 MED ORDER — FENTANYL CITRATE (PF) 100 MCG/2ML IJ SOLN
INTRAMUSCULAR | Status: AC
  Filled 2016-04-24: qty 2

## 2016-04-24 MED ORDER — LACTATED RINGERS IV SOLN
INTRAVENOUS | Status: DC | PRN
  Administered 2016-04-24: 12:00:00 via INTRAVENOUS

## 2016-04-24 MED ORDER — OXYCODONE HCL 5 MG/5ML PO SOLN: 5.0000 mg | Freq: Once | ORAL | Status: DC | PRN

## 2016-04-24 MED ORDER — OXYCODONE HCL 5 MG PO TABS: 5.0000 mg | ORAL_TABLET | Freq: Once | ORAL | Status: DC | PRN

## 2016-04-24 MED ORDER — ROCURONIUM BROMIDE 100 MG/10ML IV SOLN: INTRAVENOUS | Status: DC | PRN

## 2016-04-24 MED ORDER — PANTOPRAZOLE SODIUM 40 MG PO TBEC
40.0000 mg | DELAYED_RELEASE_TABLET | Freq: Every day | ORAL | Status: DC
Start: 2016-04-25 — End: 2016-04-24

## 2016-04-24 MED ORDER — ROCURONIUM BROMIDE 10 MG/ML (PF) SYRINGE
PREFILLED_SYRINGE | INTRAVENOUS | Status: DC | PRN
  Administered 2016-04-24: 30 mg via INTRAVENOUS
  Administered 2016-04-24 (×3): 5 mg via INTRAVENOUS

## 2016-04-24 MED ORDER — ACETAMINOPHEN 650 MG RE SUPP: 650.0000 mg | RECTAL | Status: DC | PRN

## 2016-04-24 MED ORDER — BUPIVACAINE HCL (PF) 0.25 % IJ SOLN
INTRAMUSCULAR | Status: DC | PRN
  Administered 2016-04-24: 1 mL

## 2016-04-24 MED ORDER — ONDANSETRON HCL 4 MG/2ML IJ SOLN
INTRAMUSCULAR | Status: DC | PRN
  Administered 2016-04-24: 4 mg via INTRAVENOUS

## 2016-04-24 MED ORDER — PANTOPRAZOLE SODIUM 40 MG PO TBEC
80.0000 mg | DELAYED_RELEASE_TABLET | Freq: Two times a day (BID) | ORAL | Status: DC
  Administered 2016-04-24 – 2016-04-25 (×2): 80 mg via ORAL
  Filled 2016-04-24 (×2): qty 2

## 2016-04-24 MED ORDER — MIDAZOLAM HCL 2 MG/2ML IJ SOLN
INTRAMUSCULAR | Status: AC
  Filled 2016-04-24: qty 2

## 2016-04-24 MED ORDER — TIZANIDINE HCL 4 MG PO TABS
4.0000 mg | ORAL_TABLET | Freq: Three times a day (TID) | ORAL | Status: DC | PRN
  Filled 2016-04-24: qty 1

## 2016-04-24 MED ORDER — FENTANYL CITRATE (PF) 100 MCG/2ML IJ SOLN
INTRAMUSCULAR | Status: DC | PRN
  Administered 2016-04-24 (×4): 50 ug via INTRAVENOUS

## 2016-04-24 MED ORDER — HEMOSTATIC AGENTS (NO CHARGE) OPTIME
TOPICAL | Status: DC | PRN
  Administered 2016-04-24: 1 via TOPICAL

## 2016-04-24 MED ORDER — SUGAMMADEX SODIUM 200 MG/2ML IV SOLN
INTRAVENOUS | Status: AC
  Filled 2016-04-24: qty 2

## 2016-04-24 MED ORDER — ROCURONIUM BROMIDE 10 MG/ML (PF) SYRINGE
PREFILLED_SYRINGE | INTRAVENOUS | Status: AC
  Filled 2016-04-24: qty 10

## 2016-04-24 MED ORDER — MIDAZOLAM HCL 5 MG/5ML IJ SOLN
INTRAMUSCULAR | Status: DC | PRN
  Administered 2016-04-24: 1 mg via INTRAVENOUS

## 2016-04-24 MED ORDER — SUGAMMADEX SODIUM 200 MG/2ML IV SOLN
INTRAVENOUS | Status: DC | PRN
  Administered 2016-04-24: 87.4 mg via INTRAVENOUS

## 2016-04-24 MED ORDER — BACITRACIN 50000 UNITS IM SOLR
INTRAMUSCULAR | Status: DC | PRN
  Administered 2016-04-24: 13:00:00

## 2016-04-24 MED ORDER — BUPIVACAINE HCL (PF) 0.25 % IJ SOLN
INTRAMUSCULAR | Status: AC
  Filled 2016-04-24: qty 30

## 2016-04-24 MED ORDER — ONDANSETRON HCL 4 MG/2ML IJ SOLN: 4.0000 mg | INTRAMUSCULAR | Status: DC | PRN

## 2016-04-24 MED ORDER — TRAZODONE HCL 100 MG PO TABS
100.0000 mg | ORAL_TABLET | Freq: Every day | ORAL | Status: DC
  Administered 2016-04-24: 100 mg via ORAL
  Filled 2016-04-24: qty 1

## 2016-04-24 MED ORDER — THROMBIN 5000 UNITS EX SOLR
CUTANEOUS | Status: AC
  Filled 2016-04-24: qty 15000

## 2016-04-24 MED ORDER — THROMBIN 5000 UNITS EX SOLR
OROMUCOSAL | Status: DC | PRN
  Administered 2016-04-24: 13:00:00 via TOPICAL

## 2016-04-24 MED ORDER — SODIUM CHLORIDE 0.9% FLUSH
3.0000 mL | Freq: Two times a day (BID) | INTRAVENOUS | Status: DC
Start: 2016-04-24 — End: 2016-04-25
  Administered 2016-04-24: 3 mL via INTRAVENOUS

## 2016-04-24 MED ORDER — PHENOL 1.4 % MT LIQD: 1.0000 | OROMUCOSAL | Status: DC | PRN

## 2016-04-24 MED ORDER — PROPOFOL 10 MG/ML IV BOLUS
INTRAVENOUS | Status: DC | PRN
  Administered 2016-04-24: 120 mg via INTRAVENOUS

## 2016-04-24 MED ORDER — PROMETHAZINE HCL 25 MG/ML IJ SOLN: 6.2500 mg | INTRAMUSCULAR | Status: DC | PRN

## 2016-04-24 MED ORDER — VANCOMYCIN HCL 1000 MG IV SOLR
INTRAVENOUS | Status: AC
  Filled 2016-04-24: qty 1000

## 2016-04-24 MED ORDER — FENTANYL CITRATE (PF) 100 MCG/2ML IJ SOLN
INTRAMUSCULAR | Status: AC
  Filled 2016-04-24: qty 4

## 2016-04-24 MED ORDER — THROMBIN 5000 UNITS EX SOLR
CUTANEOUS | Status: DC | PRN
  Administered 2016-04-24 (×2): 5000 [IU] via TOPICAL

## 2016-04-24 MED ORDER — FENTANYL CITRATE (PF) 100 MCG/2ML IJ SOLN
25.0000 ug | INTRAMUSCULAR | Status: DC | PRN
  Administered 2016-04-24 (×4): 25 ug via INTRAVENOUS

## 2016-04-24 MED ORDER — MORPHINE SULFATE (PF) 2 MG/ML IV SOLN
1.0000 mg | INTRAVENOUS | Status: DC | PRN
  Administered 2016-04-24 – 2016-04-25 (×3): 2 mg via INTRAVENOUS
  Filled 2016-04-24 (×3): qty 1

## 2016-04-24 SURGICAL SUPPLY — 53 items
BAG DECANTER FOR FLEXI CONT (MISCELLANEOUS) ×3 IMPLANT
BENZOIN TINCTURE PRP APPL 2/3 (GAUZE/BANDAGES/DRESSINGS) ×3 IMPLANT
BUR MATCHSTICK NEURO 3.0 LAGG (BURR) ×3 IMPLANT
CABLE OR STIMULATOR 2X8 61 (WIRE) ×4 IMPLANT
CABLE OR STIMULATOR 2X8 61CM (WIRE) ×2
CANISTER SUCT 3000ML PPV (MISCELLANEOUS) ×3 IMPLANT
CLOSURE WOUND 1/2 X4 (GAUZE/BANDAGES/DRESSINGS) ×1
DERMABOND ADVANCED (GAUZE/BANDAGES/DRESSINGS) ×2
DERMABOND ADVANCED .7 DNX12 (GAUZE/BANDAGES/DRESSINGS) ×1 IMPLANT
DRAPE C-ARM 42X72 X-RAY (DRAPES) ×6 IMPLANT
DRAPE LAPAROTOMY 100X72X124 (DRAPES) ×3 IMPLANT
DRAPE POUCH INSTRU U-SHP 10X18 (DRAPES) ×3 IMPLANT
DRAPE SURG 17X23 STRL (DRAPES) ×3 IMPLANT
DRSG OPSITE POSTOP 4X8 (GAUZE/BANDAGES/DRESSINGS) ×3 IMPLANT
DRSG TEGADERM 4X4.75 (GAUZE/BANDAGES/DRESSINGS) ×6 IMPLANT
DURAPREP 26ML APPLICATOR (WOUND CARE) ×3 IMPLANT
ELECT REM PT RETURN 9FT ADLT (ELECTROSURGICAL) ×3
ELECTRODE REM PT RTRN 9FT ADLT (ELECTROSURGICAL) ×1 IMPLANT
ELEVATER PASSER (SPINAL CORD STIMULATOR) ×3
EXTENSION 35CM (Spinal Cord Stimulator) ×12 IMPLANT
GAUZE SPONGE 4X4 16PLY XRAY LF (GAUZE/BANDAGES/DRESSINGS) IMPLANT
GLOVE BIO SURGEON STRL SZ8 (GLOVE) ×3 IMPLANT
GLOVE INDICATOR 7.5 STRL GRN (GLOVE) ×3 IMPLANT
GOWN STRL REUS W/ TWL LRG LVL3 (GOWN DISPOSABLE) IMPLANT
GOWN STRL REUS W/ TWL XL LVL3 (GOWN DISPOSABLE) ×1 IMPLANT
GOWN STRL REUS W/TWL 2XL LVL3 (GOWN DISPOSABLE) IMPLANT
GOWN STRL REUS W/TWL LRG LVL3 (GOWN DISPOSABLE)
GOWN STRL REUS W/TWL XL LVL3 (GOWN DISPOSABLE) ×2
HEMOSTAT POWDER KIT SURGIFOAM (HEMOSTASIS) ×3 IMPLANT
KIT BASIN OR (CUSTOM PROCEDURE TRAY) ×3 IMPLANT
KIT PT TRIAL SPINAL STIM 20908 (KITS) ×3 IMPLANT
KIT ROOM TURNOVER OR (KITS) ×3 IMPLANT
LEAD COVER EDGE 50CM STIM KIT (Lead) ×3 IMPLANT
NEEDLE HYPO 25X1 1.5 SAFETY (NEEDLE) ×3 IMPLANT
NEEDLE SPNL 20GX3.5 QUINCKE YW (NEEDLE) IMPLANT
NS IRRIG 1000ML POUR BTL (IV SOLUTION) ×3 IMPLANT
PACK LAMINECTOMY NEURO (CUSTOM PROCEDURE TRAY) ×3 IMPLANT
PAD ARMBOARD 7.5X6 YLW CONV (MISCELLANEOUS) ×9 IMPLANT
PADDLE BLANK SIMULATOR LEAD 32 (MISCELLANEOUS) ×3 IMPLANT
PASSER ELEVATOR (SPINAL CORD STIMULATOR) ×1 IMPLANT
RUBBERBAND STERILE (MISCELLANEOUS) IMPLANT
SPONGE SURGIFOAM ABS GEL SZ50 (HEMOSTASIS) ×3 IMPLANT
STRIP CLOSURE SKIN 1/2X4 (GAUZE/BANDAGES/DRESSINGS) ×2 IMPLANT
SUT SILK 2 0 FS (SUTURE) ×3 IMPLANT
SUT SILK 3 0 SH 30 (SUTURE) ×3 IMPLANT
SUT VIC AB 0 CT1 18XCR BRD8 (SUTURE) ×1 IMPLANT
SUT VIC AB 0 CT1 8-18 (SUTURE) ×2
SUT VIC AB 2-0 CP2 18 (SUTURE) ×3 IMPLANT
SUT VIC AB 3-0 SH 8-18 (SUTURE) ×3 IMPLANT
TOOL LONG TUNNEL (SPINAL CORD STIMULATOR) ×3 IMPLANT
TOWEL OR 17X24 6PK STRL BLUE (TOWEL DISPOSABLE) ×3 IMPLANT
TOWEL OR 17X26 10 PK STRL BLUE (TOWEL DISPOSABLE) ×3 IMPLANT
WATER STERILE IRR 1000ML POUR (IV SOLUTION) ×3 IMPLANT

## 2016-04-24 NOTE — Anesthesia Procedure Notes (Signed)
Procedure Name: Intubation Date/Time: 04/24/2016 12:14 PM Performed by: Everlean Cherry A Pre-anesthesia Checklist: Patient identified, Emergency Drugs available, Suction available and Patient being monitored Patient Re-evaluated:Patient Re-evaluated prior to inductionOxygen Delivery Method: Circle system utilized Preoxygenation: Pre-oxygenation with 100% oxygen Intubation Type: IV induction Ventilation: Mask ventilation without difficulty Laryngoscope Size: Mac and 3 Grade View: Grade II Tube type: Oral Tube size: 7.0 mm Number of attempts: 1 Airway Equipment and Method: Stylet Placement Confirmation: ETT inserted through vocal cords under direct vision,  positive ETCO2 and breath sounds checked- equal and bilateral Secured at: 22 cm Tube secured with: Tape Dental Injury: Teeth and Oropharynx as per pre-operative assessment

## 2016-04-24 NOTE — Op Note (Signed)
04/24/2016  2:10 PM  PATIENT:  Catherine Davidson  70 y.o. 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 She has undergone a previous decompression at L1-2. She has continued back and leg pain unresponsive to medical therapy. Further corrective surgery was not deemed appropriate. She presents today for open spinal cord stimulator trial placement. Patient understood the risks, benefits, and alternatives and potential outcomes and wished to proceed.  PROCEDURE DETAILS: The patient was taken to the operating room and after induction of adequate generalized endotracheal anesthesia she was rolled into the prone position on chest rolls and all pressure points were padded. Her thoracic region was cleaned and then prepped with DuraPrep and then draped in usual sterile fashion. 1 mL of of local anesthesia was injected in dorsal midline incision was made over T910. The paraspinous musculature was taken out in a subperiosteal fashion to expose T9-10. Intraoperative fluoroscopy confirm our level and then he used the high-speed drill and Kerrison punches to very carefully perform a bilateral laminectomy at T9. She had significant spondylosis. This was undercut and removed. I then passed the first trial right passer and it passed easily. Therefore I used a medium permanent trial and passed it until the top was just above the T7-8 disc space. Checked this with AP fluoroscopy. I then tied the leads to the fascia. I then attached the leads to extenders and then we  checked impedances. They were fine. I created pockets in order to store the leads. I used the passer to pass from the incision superiorly to the right superior thoracic region. I passed the leads through this and pulled the passer out. I then passed the residual wire into the pockets I created. We checked with AP fluoroscopy. I irrigated with bacitracin-containing saline solution. I dried the surgical bed with Surgifoam and with Gelfoam. I then closed the fascia with 2-0 Vicryl. Closed the subcutaneous tissue with 2-0 Vicryl. Closed the subcuticular tissue with 3-0 Vicryl. The skin was closed with Dermabond and benzoin and Steri-Strips. A sterile dressing was applied. The patient was then awakened from general anesthesia and transferred to the recovery room. At the end of procedure all sponge needle and instrument counts were correct.  PLAN OF CARE: Admit for overnight observation  PATIENT DISPOSITION:  PACU - hemodynamically stable.   Delay start of Pharmacological VTE agent (>24hrs) due to surgical blood loss or risk of bleeding:  yes

## 2016-04-24 NOTE — Anesthesia Preprocedure Evaluation (Signed)
Anesthesia Evaluation  Patient identified by MRN, date of birth, ID band Patient awake    Reviewed: Allergy & Precautions, NPO status , Patient's Chart, lab work & pertinent test results  Airway Mallampati: II  TM Distance: >3 FB Neck ROM: Full    Dental  (+) Teeth Intact, Dental Advisory Given   Pulmonary former smoker,    Pulmonary exam normal breath sounds clear to auscultation       Cardiovascular Exercise Tolerance: Good (-) hypertension(-) angina(-) CAD and (-) Past MI negative cardio ROS Normal cardiovascular exam Rhythm:Regular Rate:Normal     Neuro/Psych  Headaches, PSYCHIATRIC DISORDERS Depression    GI/Hepatic Neg liver ROS, hiatal hernia, GERD  Medicated,  Endo/Other  negative endocrine ROS  Renal/GU negative Renal ROS     Musculoskeletal  (+) Arthritis , Osteoarthritis,    Abdominal   Peds  Hematology negative hematology ROS (+)   Anesthesia Other Findings Day of surgery medications reviewed with the patient.  Breast ca s/p left breast lumpectomy  Reproductive/Obstetrics                             Anesthesia Physical  Anesthesia Plan  ASA: II  Anesthesia Plan: General   Post-op Pain Management:    Induction: Intravenous  Airway Management Planned: Oral ETT  Additional Equipment:   Intra-op Plan:   Post-operative Plan: Extubation in OR  Informed Consent: I have reviewed the patients History and Physical, chart, labs and discussed the procedure including the risks, benefits and alternatives for the proposed anesthesia with the patient or authorized representative who has indicated his/her understanding and acceptance.   Dental advisory given  Plan Discussed with: CRNA  Anesthesia Plan Comments: (Risks/benefits of general anesthesia discussed with patient including risk of damage to teeth, lips, gum, and tongue, nausea/vomiting, allergic reactions to  medications, and the possibility of heart attack, stroke and death.  All patient questions answered.  Patient wishes to proceed.)        Anesthesia Quick Evaluation

## 2016-04-24 NOTE — Transfer of Care (Signed)
Immediate Anesthesia Transfer of Care Note  Patient: Catherine Davidson  Procedure(s) Performed: Procedure(s) with comments: LUMBAR SPINAL CORD STIMULATOR TRIAL (N/A) - LUMBAR SPINAL CORD STIMULATOR TRIAL  Patient Location: PACU  Anesthesia Type:General  Level of Consciousness: awake, alert , oriented and patient cooperative  Airway & Oxygen Therapy: Patient Spontanous Breathing and Patient connected to nasal cannula oxygen  Post-op Assessment: Report given to RN, Post -op Vital signs reviewed and stable and Patient moving all extremities X 4  Post vital signs: Reviewed and stable  Last Vitals:  Vitals:   04/24/16 0812  BP: 137/65  Pulse: 63  Resp: 18  Temp: 36.7 C    Last Pain:  Vitals:   04/24/16 0942  TempSrc:   PainSc: 10-Worst pain ever      Patients Stated Pain Goal: 1 ([id number])  Complications: No apparent anesthesia complications

## 2016-04-24 NOTE — H&P (Signed)
Subjective: Patient is a 70 y.o. female admitted for SCS trial. Onset of symptoms was several years ago, gradually worsening since that time.  The pain is rated severe, and is located at the across the lower back and radiates to legs. The pain is described as aching and occurs all day. The symptoms have been progressive. Symptoms are exacerbated by exercise. MRI or CT showed severe spondylosis with spondylolisthesis with previous back surgery.   Past Medical History:  Diagnosis Date  . Arthritis    lumbar- HNP, OA- hands, & "all of my back"  . Cancer ([place]) 1997   Breast - L- lumpectomy , treated /w radiation  . Depression   . GERD (gastroesophageal reflux disease)   . Headache    h/o migraines   . History of hiatal hernia   . Hyperlipidemia   . Insomnia   . Urinary frequency     Past Surgical History:  Procedure Laterality Date  . BACK SURGERY  2012   [facility]- Dr. Kela Millin   . BREAST SURGERY Left    lumpectomy  . CARPAL TUNNEL RELEASE Right 2000  . EYE SURGERY Bilateral    cataracts removed  . LUMBAR LAMINECTOMY/DECOMPRESSION MICRODISCECTOMY Right 07/05/2015   Procedure: LUMBAR LAMINECTOMY/DECOMPRESSION MICRODISCECTOMY 1 LEVEL;  Surgeon: Eustace Moore, MD;  Location: [facility] NEURO ORS;  Service: Neurosurgery;  Laterality: Right;  LUMBAR LAMINECTOMY/DECOMPRESSION MICRODISCECTOMY 1 LEVEL  . TONSILLECTOMY    . TUBAL LIGATION    . UPPER GI ENDOSCOPY     to stretch esophagus     Prior to Admission medications   Medication Sig Start Date End Date Taking? Authorizing Provider  atorvastatin (LIPITOR) 10 MG tablet Take 10 mg by mouth at bedtime.    Yes Historical Provider, MD  b complex vitamins tablet Take 1 tablet by mouth daily.   Yes Historical Provider, MD  Cholecalciferol (VITAMIN D3) 5000 UNITS TABS Take 5,000 Units by mouth daily.    Yes Historical Provider, MD  esomeprazole (NEXIUM) 40 MG capsule Take 40 mg by mouth 2 (two) times daily before a meal.   Yes Historical Provider, MD   famotidine-calcium carbonate-magnesium hydroxide (PEPCID COMPLETE) 10-800-165 MG chewable tablet Chew 1 tablet by mouth 2 (two) times daily as needed (for heartburn/indigestion).    Yes Historical Provider, MD  FLUoxetine (PROZAC) 40 MG capsule Take 40 mg by mouth every evening.  02/27/16  Yes Historical Provider, MD  HYDROcodone-acetaminophen (NORCO) 10-325 MG tablet Take 2 tablets by mouth every 8 (eight) hours.    Yes Historical Provider, MD  lidocaine (LIDODERM) 5 % Place 1 patch onto the skin daily as needed (for pain). Remove & Discard patch within 12 hours or as directed by MD    Yes Historical Provider, MD  mirtazapine (REMERON) 7.5 MG tablet Take 7.5 mg by mouth every evening. 03/12/16  Yes Historical Provider, MD  ondansetron (ZOFRAN-ODT) 4 MG disintegrating tablet Take 1-2 tablets by mouth every 8 (eight) hours as needed (for nausea).  04/17/15  Yes Historical Provider, MD  Polyethyl Glycol-Propyl Glycol (SYSTANE) 0.4-0.3 % GEL ophthalmic gel Place 1 application into both eyes at bedtime.   Yes Historical Provider, MD  Polyethyl Glycol-Propyl Glycol 0.4-0.3 % SOLN Apply 1-2 drops to eye 4 (four) times daily as needed (for dry eyes).   Yes Historical Provider, MD  polyethylene glycol (MIRALAX / GLYCOLAX) packet Take 17-34 g by mouth daily.   Yes Historical Provider, MD  tiZANidine (ZANAFLEX) 4 MG tablet Take 4-8 mg by mouth 3 (three) times daily  as needed. For muscle spasm. 03/26/16  Yes Historical Provider, MD  traZODone (DESYREL) 100 MG tablet Take 100 mg by mouth at bedtime.   Yes Historical Provider, MD  docusate sodium (COLACE) 100 MG capsule Take 100 mg by mouth 3 (three) times daily as needed for mild constipation.    Historical Provider, MD  sennosides-docusate sodium (SENOKOT-S) 8.6-50 MG tablet Take 2-4 tablets by mouth daily as needed for constipation.     Historical Provider, MD   Allergies  Allergen Reactions  . Sulfa Antibiotics Hives  . Nickel Itching and Other (See Comments)     SKIN TURNS BLACK   . Nitrofuran Derivatives Hives    Social History  Substance Use Topics  . Smoking status: Former Smoker    Packs/day: 1.50    Years: 25.00    Types: Cigarettes    Quit date: 07/13/1993  . Smokeless tobacco: Former Systems developer    Quit date: 07/01/1994  . Alcohol use No    History reviewed. No pertinent family history.   Review of Systems  Positive ROS: neg  All other systems have been reviewed and were otherwise negative with the exception of those mentioned in the HPI and as above.  Objective: Vital signs in last 24 hours: Temp:  [98 F (36.7 C)] 98 F (36.7 C) (10/13 0812) Pulse Rate:  [63] 63 (10/13 0812) Resp:  [18] 18 (10/13 0812) BP: (137)/(65) 137/65 (10/13 0812) SpO2:  [98 %] 98 % (10/13 0812) Weight:  [43.7 kg (96 lb 6.4 oz)] 43.7 kg (96 lb 6.4 oz) (10/13 0812)  General Appearance: Alert, cooperative, no distress, appears stated age Head: Normocephalic, without obvious abnormality, atraumatic Eyes: PERRL, conjunctiva/corneas clear, EOM's intact    Neck: Supple, symmetrical, trachea midline Back: Symmetric, no curvature, ROM normal, no CVA tenderness Lungs:  respirations unlabored Heart: Regular rate and rhythm Abdomen: Soft, non-tender Extremities: Extremities normal, atraumatic, no cyanosis or edema Pulses: 2+ and symmetric all extremities Skin: Skin color, texture, turgor normal, no rashes or lesions  NEUROLOGIC:   Mental status: Alert and oriented x4,  no aphasia, good attention span, fund of knowledge, and memory Motor Exam - grossly normal Sensory Exam - grossly normal Reflexes: 1= Coordination - grossly normal Gait - grossly normal Balance - grossly normal Cranial Nerves: I: smell Not tested  II: visual acuity  OS: nl    OD: nl  II: visual fields Full to confrontation  II: pupils Equal, round, reactive to light  III,VII: ptosis None  III,IV,VI: extraocular muscles  Full ROM  V: mastication Normal  V: facial light touch  sensation  Normal  V,VII: corneal reflex  Present  VII: facial muscle function - upper  Normal  VII: facial muscle function - lower Normal  VIII: hearing Not tested  IX: soft palate elevation  Normal  IX,X: gag reflex Present  XI: trapezius strength  5/5  XI: sternocleidomastoid strength 5/5  XI: neck flexion strength  5/5  XII: tongue strength  Normal    Data Review Lab Results  Component Value Date   WBC 6.7 04/20/2016   HGB 10.6 (L) 04/20/2016   HCT 34.2 (L) 04/20/2016   MCV 92.9 04/20/2016   PLT 278 04/20/2016   Lab Results  Component Value Date   NA 144 07/02/2015   K 3.6 07/02/2015   CL 107 07/02/2015   CO2 30 07/02/2015   BUN 6 07/02/2015   CREATININE 0.71 07/02/2015   GLUCOSE 105 (H) 07/02/2015   Lab Results  Component Value Date  INR 0.88 07/02/2015    Assessment/Plan: Patient admitted for SCS trial (staged procedure). Patient has failed a reasonable attempt at conservative therapy.  I explained the condition and procedure to the patient and answered any questions.  Patient wishes to proceed with procedure as planned. Understands risks/ benefits and typical outcomes of procedure.   Marlette Curvin S 04/24/2016 11:32 AM

## 2016-04-24 NOTE — Progress Notes (Signed)
Pt admitted to room from pacu; pt A&O x4; VSS; IV intact and transfusing; pt oriented to the unit and room; call light within reach; fall safety precaution and prevention education completed with pt who voices understanding; back incision has honeycomb dsg along with gauze dsg which has 2 devices intact to site. Pt voided x1 with assistance to bathroom. Pt in bed with call light within reach. Will closely monitor. Delia Heady RN

## 2016-04-24 NOTE — Anesthesia Postprocedure Evaluation (Signed)
Anesthesia Post Note  Patient: Catherine Davidson  Procedure(s) Performed: Procedure(s) (LRB): LUMBAR SPINAL CORD STIMULATOR TRIAL (N/A)  Patient location during evaluation: PACU Anesthesia Type: General Level of consciousness: awake and alert Pain management: pain level controlled Vital Signs Assessment: post-procedure vital signs reviewed and stable Respiratory status: spontaneous breathing, nonlabored ventilation and respiratory function stable Cardiovascular status: blood pressure returned to baseline and stable Postop Assessment: no signs of nausea or vomiting Anesthetic complications: no    Last Vitals:  Vitals:   04/24/16 1619 04/24/16 1652  BP:  (!) 132/54  Pulse:    Resp:  18  Temp: 36.3 C 36.7 C    Last Pain:  Vitals:   04/24/16 2000  TempSrc:   PainSc: 2                  Luzelena Heeg A

## 2016-04-25 DIAGNOSIS — M961 Postlaminectomy syndrome, not elsewhere classified: Secondary | ICD-10-CM | POA: Diagnosis not present

## 2016-04-25 MED ORDER — HYDROCODONE-ACETAMINOPHEN 10-325 MG PO TABS: 2.0000 | ORAL_TABLET | Freq: Three times a day (TID) | ORAL | 0 refills | Status: AC

## 2016-04-25 NOTE — Progress Notes (Signed)
Report received of high temperature of 100.5, re-assessed patient's temp=>? Its 99.2.Marland Kitchenmed administered as ordered(see mar)./ Will continue to monitor

## 2016-04-25 NOTE — Progress Notes (Signed)
Discharge instruction reviewed with patient. Script to [facility] is given to patient with Camera operator as witness

## 2016-04-25 NOTE — Progress Notes (Signed)
No issues overnight. Reports significant incisional pain. Has not been OOB yet.  EXAM:  BP 105/75 (BP Location: Right Arm)   Pulse 71   Temp 98.6 F (37 C) (Oral)   Resp 18   Ht 5' 1.5" (1.562 m)   Wt 43.7 kg (96 lb 6.4 oz)   SpO2 93%   BMI 17.92 kg/m   Awake, alert Speech fluent, appropriate  CN grossly intact  5/5 BUE/BLE   IMPRESSION:  70 y.o. female POD#1 spinal cord stimulator trial placement  PLAN: - Mobilize today, - Possible discharge later this pm.

## 2016-04-28 ENCOUNTER — Encounter (HOSPITAL_COMMUNITY): Payer: Self-pay | Admitting: Neurological Surgery

## 2016-04-30 ENCOUNTER — Encounter (HOSPITAL_COMMUNITY): Payer: Self-pay | Admitting: *Deleted

## 2016-04-30 NOTE — Progress Notes (Signed)
Catherine Davidson reports that she had some leakage from her back during the night last night. Patient said it was enough to get her gown wet, she then laid on a towel and has stayed down today.  Patient denies headache today, reports that she did have a headache in the evening yesterday.

## 2016-05-01 ENCOUNTER — Encounter (HOSPITAL_COMMUNITY): Payer: Self-pay | Admitting: *Deleted

## 2016-05-01 ENCOUNTER — Ambulatory Visit (HOSPITAL_COMMUNITY)
Admission: RE | Admit: 2016-05-01 | Discharge: 2016-05-01 | Disposition: A | Discharge status: Home / Self Care | Payer: BLUE CROSS/BLUE SHIELD | Source: Ambulatory Visit | Attending: Neurological Surgery | Admitting: Neurological Surgery

## 2016-05-01 ENCOUNTER — Encounter (HOSPITAL_COMMUNITY)
Admission: RE | Disposition: A | Discharge status: Home / Self Care | Payer: Self-pay | Source: Ambulatory Visit | Attending: Neurological Surgery

## 2016-05-01 ENCOUNTER — Ambulatory Visit (HOSPITAL_COMMUNITY): Payer: BLUE CROSS/BLUE SHIELD | Admitting: Anesthesiology

## 2016-05-01 DIAGNOSIS — F329 Major depressive disorder, single episode, unspecified: Secondary | ICD-10-CM | POA: Diagnosis not present

## 2016-05-01 DIAGNOSIS — Z87891 Personal history of nicotine dependence: Secondary | ICD-10-CM | POA: Insufficient documentation

## 2016-05-01 DIAGNOSIS — Z9889 Other specified postprocedural states: Secondary | ICD-10-CM

## 2016-05-01 DIAGNOSIS — Z923 Personal history of irradiation: Secondary | ICD-10-CM | POA: Diagnosis not present

## 2016-05-01 DIAGNOSIS — E785 Hyperlipidemia, unspecified: Secondary | ICD-10-CM | POA: Diagnosis not present

## 2016-05-01 DIAGNOSIS — M549 Dorsalgia, unspecified: Secondary | ICD-10-CM | POA: Insufficient documentation

## 2016-05-01 DIAGNOSIS — Z888 Allergy status to other drugs, medicaments and biological substances status: Secondary | ICD-10-CM | POA: Diagnosis not present

## 2016-05-01 DIAGNOSIS — Z853 Personal history of malignant neoplasm of breast: Secondary | ICD-10-CM | POA: Insufficient documentation

## 2016-05-01 DIAGNOSIS — M479 Spondylosis, unspecified: Secondary | ICD-10-CM | POA: Insufficient documentation

## 2016-05-01 DIAGNOSIS — K449 Diaphragmatic hernia without obstruction or gangrene: Secondary | ICD-10-CM | POA: Insufficient documentation

## 2016-05-01 DIAGNOSIS — Z882 Allergy status to sulfonamides status: Secondary | ICD-10-CM | POA: Diagnosis not present

## 2016-05-01 DIAGNOSIS — Z9689 Presence of other specified functional implants: Secondary | ICD-10-CM

## 2016-05-01 DIAGNOSIS — M19041 Primary osteoarthritis, right hand: Secondary | ICD-10-CM | POA: Diagnosis not present

## 2016-05-01 DIAGNOSIS — G8929 Other chronic pain: Secondary | ICD-10-CM | POA: Insufficient documentation

## 2016-05-01 DIAGNOSIS — G4700 Insomnia, unspecified: Secondary | ICD-10-CM | POA: Diagnosis not present

## 2016-05-01 DIAGNOSIS — M19042 Primary osteoarthritis, left hand: Secondary | ICD-10-CM | POA: Insufficient documentation

## 2016-05-01 DIAGNOSIS — K219 Gastro-esophageal reflux disease without esophagitis: Secondary | ICD-10-CM | POA: Diagnosis not present

## 2016-05-01 HISTORY — PX: SPINAL CORD STIMULATOR INSERTION: SHX5378

## 2016-05-01 SURGERY — INSERTION, SPINAL CORD STIMULATOR, LUMBAR
Anesthesia: General | Site: Back

## 2016-05-01 MED ORDER — MIRTAZAPINE 15 MG PO TABS
7.5000 mg | ORAL_TABLET | Freq: Every evening | ORAL | Status: DC
  Filled 2016-05-01: qty 1

## 2016-05-01 MED ORDER — THROMBIN 5000 UNITS EX SOLR
OROMUCOSAL | Status: DC | PRN
  Administered 2016-05-01: 09:00:00 via TOPICAL

## 2016-05-01 MED ORDER — ONDANSETRON HCL 4 MG/2ML IJ SOLN: 4.0000 mg | Freq: Once | INTRAMUSCULAR | Status: DC | PRN

## 2016-05-01 MED ORDER — THROMBIN 5000 UNITS EX SOLR
CUTANEOUS | Status: AC
  Filled 2016-05-01: qty 15000

## 2016-05-01 MED ORDER — ACETAMINOPHEN 325 MG PO TABS: 650.0000 mg | ORAL_TABLET | ORAL | Status: DC | PRN

## 2016-05-01 MED ORDER — ONDANSETRON HCL 4 MG/2ML IJ SOLN
INTRAMUSCULAR | Status: DC | PRN
  Administered 2016-05-01: 4 mg via INTRAVENOUS

## 2016-05-01 MED ORDER — CHLORHEXIDINE GLUCONATE CLOTH 2 % EX PADS: 6.0000 | MEDICATED_PAD | Freq: Once | CUTANEOUS | Status: DC

## 2016-05-01 MED ORDER — VANCOMYCIN HCL 1000 MG IV SOLR
INTRAVENOUS | Status: DC | PRN
  Administered 2016-05-01: 1000 mg via TOPICAL

## 2016-05-01 MED ORDER — TIZANIDINE HCL 4 MG PO TABS: 4.0000 mg | ORAL_TABLET | Freq: Three times a day (TID) | ORAL | Status: DC | PRN

## 2016-05-01 MED ORDER — MIDAZOLAM HCL 5 MG/5ML IJ SOLN
INTRAMUSCULAR | Status: DC | PRN
Start: 2016-05-01 — End: 2016-05-04
  Administered 2016-05-01: 1 mg via INTRAVENOUS

## 2016-05-01 MED ORDER — FENTANYL CITRATE (PF) 100 MCG/2ML IJ SOLN
25.0000 ug | INTRAMUSCULAR | Status: DC | PRN
  Administered 2016-05-01 (×3): 50 ug via INTRAVENOUS

## 2016-05-01 MED ORDER — ROCURONIUM BROMIDE 100 MG/10ML IV SOLN
INTRAVENOUS | Status: DC | PRN
  Administered 2016-05-01: 40 mg via INTRAVENOUS

## 2016-05-01 MED ORDER — SODIUM CHLORIDE 0.9 % IV SOLN: 250.0000 mL | INTRAVENOUS | Status: DC

## 2016-05-01 MED ORDER — FENTANYL CITRATE (PF) 100 MCG/2ML IJ SOLN
INTRAMUSCULAR | Status: AC
  Filled 2016-05-01: qty 2

## 2016-05-01 MED ORDER — BUPIVACAINE HCL (PF) 0.25 % IJ SOLN
INTRAMUSCULAR | Status: DC | PRN
  Administered 2016-05-01: 3 mL

## 2016-05-01 MED ORDER — SUGAMMADEX SODIUM 200 MG/2ML IV SOLN
INTRAVENOUS | Status: DC | PRN
  Administered 2016-05-01: 200 mg via INTRAVENOUS

## 2016-05-01 MED ORDER — VANCOMYCIN HCL 1000 MG IV SOLR
INTRAVENOUS | Status: AC
  Filled 2016-05-01: qty 1000

## 2016-05-01 MED ORDER — PROPOFOL 10 MG/ML IV BOLUS
INTRAVENOUS | Status: DC | PRN
  Administered 2016-05-01: 100 mg via INTRAVENOUS

## 2016-05-01 MED ORDER — CEFAZOLIN SODIUM-DEXTROSE 2-4 GM/100ML-% IV SOLN
2.0000 g | INTRAVENOUS | Status: AC
  Administered 2016-05-01: 2 g via INTRAVENOUS

## 2016-05-01 MED ORDER — FENTANYL CITRATE (PF) 100 MCG/2ML IJ SOLN
INTRAMUSCULAR | Status: DC | PRN
  Administered 2016-05-01: 50 ug via INTRAVENOUS

## 2016-05-01 MED ORDER — LACTATED RINGERS IV SOLN
INTRAVENOUS | Status: DC | PRN
  Administered 2016-05-01: 08:00:00 via INTRAVENOUS

## 2016-05-01 MED ORDER — POTASSIUM CHLORIDE IN NACL 20-0.9 MEQ/L-% IV SOLN
INTRAVENOUS | Status: DC
  Filled 2016-05-01: qty 1000

## 2016-05-01 MED ORDER — SODIUM CHLORIDE 0.9% FLUSH: 3.0000 mL | INTRAVENOUS | Status: DC | PRN

## 2016-05-01 MED ORDER — FENTANYL CITRATE (PF) 100 MCG/2ML IJ SOLN: 25.0000 ug | INTRAMUSCULAR | Status: DC | PRN

## 2016-05-01 MED ORDER — THROMBIN 5000 UNITS EX SOLR
CUTANEOUS | Status: DC | PRN
  Administered 2016-05-01 (×2): 5000 [IU] via TOPICAL

## 2016-05-01 MED ORDER — CEFAZOLIN IN D5W 1 GM/50ML IV SOLN
1.0000 g | Freq: Three times a day (TID) | INTRAVENOUS | Status: DC
  Filled 2016-05-01 (×2): qty 50

## 2016-05-01 MED ORDER — ONDANSETRON HCL 4 MG/2ML IJ SOLN: 4.0000 mg | INTRAMUSCULAR | Status: DC | PRN

## 2016-05-01 MED ORDER — BUPIVACAINE HCL (PF) 0.25 % IJ SOLN
INTRAMUSCULAR | Status: AC
  Filled 2016-05-01: qty 30

## 2016-05-01 MED ORDER — 0.9 % SODIUM CHLORIDE (POUR BTL) OPTIME
TOPICAL | Status: DC | PRN
  Administered 2016-05-01: 1000 mL

## 2016-05-01 MED ORDER — TRAZODONE HCL 100 MG PO TABS: 100.0000 mg | ORAL_TABLET | Freq: Every day | ORAL | Status: DC

## 2016-05-01 MED ORDER — ACETAMINOPHEN 650 MG RE SUPP: 650.0000 mg | RECTAL | Status: DC | PRN

## 2016-05-01 MED ORDER — FENTANYL CITRATE (PF) 100 MCG/2ML IJ SOLN
INTRAMUSCULAR | Status: AC
  Filled 2016-05-01: qty 4

## 2016-05-01 MED ORDER — SODIUM CHLORIDE 0.9 % IR SOLN
Status: DC | PRN
  Administered 2016-05-01: 09:00:00

## 2016-05-01 MED ORDER — HYDROCODONE-ACETAMINOPHEN 10-325 MG PO TABS
2.0000 | ORAL_TABLET | Freq: Three times a day (TID) | ORAL | Status: DC
  Administered 2016-05-01: 2 via ORAL
  Filled 2016-05-01: qty 2

## 2016-05-01 MED ORDER — CEFAZOLIN SODIUM-DEXTROSE 2-4 GM/100ML-% IV SOLN
INTRAVENOUS | Status: AC
  Filled 2016-05-01: qty 100

## 2016-05-01 MED ORDER — SODIUM CHLORIDE 0.9% FLUSH: 3.0000 mL | Freq: Two times a day (BID) | INTRAVENOUS | Status: DC

## 2016-05-01 MED ORDER — MORPHINE SULFATE (PF) 2 MG/ML IV SOLN: 1.0000 mg | INTRAVENOUS | Status: DC | PRN

## 2016-05-01 MED ORDER — FLUOXETINE HCL 20 MG PO CAPS
40.0000 mg | ORAL_CAPSULE | Freq: Every evening | ORAL | Status: DC
  Filled 2016-05-01: qty 2

## 2016-05-01 MED ORDER — PHENOL 1.4 % MT LIQD: 1.0000 | OROMUCOSAL | Status: DC | PRN

## 2016-05-01 MED ORDER — MIDAZOLAM HCL 2 MG/2ML IJ SOLN
INTRAMUSCULAR | Status: AC
  Filled 2016-05-01: qty 2

## 2016-05-01 MED ORDER — LIDOCAINE HCL (CARDIAC) 20 MG/ML IV SOLN
INTRAVENOUS | Status: DC | PRN
  Administered 2016-05-01: 50 mg via INTRATRACHEAL

## 2016-05-01 MED ORDER — MENTHOL 3 MG MT LOZG: 1.0000 | LOZENGE | OROMUCOSAL | Status: DC | PRN

## 2016-05-01 SURGICAL SUPPLY — 51 items
BAG DECANTER FOR FLEXI CONT (MISCELLANEOUS) ×3 IMPLANT
BENZOIN TINCTURE PRP APPL 2/3 (GAUZE/BANDAGES/DRESSINGS) ×3 IMPLANT
BUR MATCHSTICK NEURO 3.0 LAGG (BURR) ×3 IMPLANT
CANISTER SUCT 3000ML PPV (MISCELLANEOUS) ×3 IMPLANT
CLOSURE WOUND 1/2 X4 (GAUZE/BANDAGES/DRESSINGS) ×1
DERMABOND ADVANCED (GAUZE/BANDAGES/DRESSINGS) ×6
DERMABOND ADVANCED .7 DNX12 (GAUZE/BANDAGES/DRESSINGS) ×3 IMPLANT
DRAPE C-ARM 42X72 X-RAY (DRAPES) ×3 IMPLANT
DRAPE LAPAROTOMY 100X72X124 (DRAPES) ×3 IMPLANT
DRAPE POUCH INSTRU U-SHP 10X18 (DRAPES) ×3 IMPLANT
DRAPE SURG 17X23 STRL (DRAPES) ×3 IMPLANT
DRSG OPSITE POSTOP 4X6 (GAUZE/BANDAGES/DRESSINGS) IMPLANT
DURAPREP 26ML APPLICATOR (WOUND CARE) ×3 IMPLANT
ELECT REM PT RETURN 9FT ADLT (ELECTROSURGICAL) ×3
ELECTRODE REM PT RTRN 9FT ADLT (ELECTROSURGICAL) ×1 IMPLANT
GAUZE SPONGE 4X4 16PLY XRAY LF (GAUZE/BANDAGES/DRESSINGS) IMPLANT
GLOVE BIO SURGEON STRL SZ8 (GLOVE) ×3 IMPLANT
GLOVE BIOGEL PI IND STRL 7.0 (GLOVE) ×2 IMPLANT
GLOVE BIOGEL PI IND STRL 7.5 (GLOVE) ×4 IMPLANT
GLOVE BIOGEL PI INDICATOR 7.0 (GLOVE) ×4
GLOVE BIOGEL PI INDICATOR 7.5 (GLOVE) ×8
GLOVE SURG SS PI 7.0 STRL IVOR (GLOVE) ×9 IMPLANT
GOWN STRL REUS W/ TWL LRG LVL3 (GOWN DISPOSABLE) ×2 IMPLANT
GOWN STRL REUS W/ TWL XL LVL3 (GOWN DISPOSABLE) ×3 IMPLANT
GOWN STRL REUS W/TWL 2XL LVL3 (GOWN DISPOSABLE) IMPLANT
GOWN STRL REUS W/TWL LRG LVL3 (GOWN DISPOSABLE) ×4
GOWN STRL REUS W/TWL XL LVL3 (GOWN DISPOSABLE) ×6
HEMOSTAT POWDER KIT SURGIFOAM (HEMOSTASIS) ×3 IMPLANT
IPG PRECISION SPECTRA (Stimulator) ×3 IMPLANT
KIT BASIN OR (CUSTOM PROCEDURE TRAY) ×3 IMPLANT
KIT CHARGING (KITS) ×2
KIT CHARGING PRECISION NEURO (KITS) ×1 IMPLANT
KIT PAT PROGRAM FREELINK (KITS) ×1 IMPLANT
KIT ROOM TURNOVER OR (KITS) ×3 IMPLANT
NEEDLE HYPO 25X1 1.5 SAFETY (NEEDLE) ×3 IMPLANT
NEEDLE SPNL 20GX3.5 QUINCKE YW (NEEDLE) ×3 IMPLANT
NS IRRIG 1000ML POUR BTL (IV SOLUTION) ×3 IMPLANT
PACK LAMINECTOMY NEURO (CUSTOM PROCEDURE TRAY) ×3 IMPLANT
PAD ARMBOARD 7.5X6 YLW CONV (MISCELLANEOUS) ×9 IMPLANT
REMOTE CONTROL KIT (KITS) ×3
RUBBERBAND STERILE (MISCELLANEOUS) IMPLANT
SPONGE SURGIFOAM ABS GEL SZ50 (HEMOSTASIS) ×3 IMPLANT
STRIP CLOSURE SKIN 1/2X4 (GAUZE/BANDAGES/DRESSINGS) ×2 IMPLANT
SUT VIC AB 0 CT1 18XCR BRD8 (SUTURE) ×2 IMPLANT
SUT VIC AB 0 CT1 8-18 (SUTURE) ×4
SUT VIC AB 2-0 CP2 18 (SUTURE) ×6 IMPLANT
SUT VIC AB 3-0 SH 8-18 (SUTURE) ×6 IMPLANT
TOOL LONG TUNNEL (SPINAL CORD STIMULATOR) ×3 IMPLANT
TOWEL OR 17X24 6PK STRL BLUE (TOWEL DISPOSABLE) ×3 IMPLANT
TOWEL OR 17X26 10 PK STRL BLUE (TOWEL DISPOSABLE) ×3 IMPLANT
WATER STERILE IRR 1000ML POUR (IV SOLUTION) ×3 IMPLANT

## 2016-05-01 NOTE — Progress Notes (Signed)
Discharge orders received.  Discharge instructions and follow-up appointments reviewed with the patient.  VSS upon discharge.  IV removed and education complete.  Transported out via wheelchair.   Candido Flott M, RN 

## 2016-05-01 NOTE — H&P (Signed)
Subjective: Patient is a 70 y.o. female admitted for spinal cord stimulator battery placement. Onset of symptoms was several months ago, gradually worsening since that time.  The pain is rated severe, and is located at the across the lower back and radiates to right hip. The pain is described as aching and occurs all day. The symptoms have been progressive. Symptoms are exacerbated by exercise.   Past Medical History:  Diagnosis Date  . Arthritis    lumbar- HNP, OA- hands, & "all of my back"  . Cancer ([place]) 1997   Breast - L- lumpectomy , treated /w radiation.Radiation  . Depression   . GERD (gastroesophageal reflux disease)   . Headache    h/o migraines   . History of hiatal hernia   . Hyperlipidemia   . Insomnia   . Urinary frequency     Past Surgical History:  Procedure Laterality Date  . BACK SURGERY  2012   [facility]- Dr. Kela Millin   . BREAST SURGERY Left    lumpectomy  . CARPAL TUNNEL RELEASE Right 2000  . COLONOSCOPY    . EYE SURGERY Bilateral    cataracts removed  . LUMBAR LAMINECTOMY/DECOMPRESSION MICRODISCECTOMY Right 07/05/2015   Procedure: LUMBAR LAMINECTOMY/DECOMPRESSION MICRODISCECTOMY 1 LEVEL;  Surgeon: Eustace Moore, MD;  Location: [facility];  Service: Neurosurgery;  Laterality: Right;  LUMBAR LAMINECTOMY/DECOMPRESSION MICRODISCECTOMY 1 LEVEL  . SPINAL CORD STIMULATOR TRIAL N/A 04/24/2016   Procedure: LUMBAR SPINAL CORD STIMULATOR TRIAL;  Surgeon: Eustace Moore, MD;  Location: [facility];  Service: Neurosurgery;  Laterality: N/A;  LUMBAR SPINAL CORD STIMULATOR TRIAL  . TONSILLECTOMY    . TUBAL LIGATION    . UPPER GI ENDOSCOPY     to stretch esophagus     Prior to Admission medications   Medication Sig Start Date End Date Taking? Authorizing Provider  atorvastatin (LIPITOR) 10 MG tablet Take 10 mg by mouth at bedtime.    Yes Historical Provider, MD  b complex vitamins tablet Take 1 tablet by mouth daily.   Yes Historical Provider, MD  Cholecalciferol (VITAMIN D3) 5000  UNITS TABS Take 5,000 Units by mouth daily.    Yes Historical Provider, MD  esomeprazole (NEXIUM) 40 MG capsule Take 40 mg by mouth 2 (two) times daily before a meal.   Yes Historical Provider, MD  famotidine-calcium carbonate-magnesium hydroxide (PEPCID COMPLETE) 10-800-165 MG chewable tablet Chew 1 tablet by mouth 2 (two) times daily as needed (for heartburn/indigestion).    Yes Historical Provider, MD  FLUoxetine (PROZAC) 40 MG capsule Take 40 mg by mouth every evening.  02/27/16  Yes Historical Provider, MD  HYDROcodone-acetaminophen (NORCO) 10-325 MG tablet Take 2 tablets by mouth every 8 (eight) hours. 04/25/16  Yes Consuella Lose, MD  lidocaine (LIDODERM) 5 % Place 1 patch onto the skin daily as needed (for pain). Remove & Discard patch within 12 hours or as directed by MD    Yes Historical Provider, MD  mirtazapine (REMERON) 7.5 MG tablet Take 7.5 mg by mouth every evening. 03/12/16  Yes Historical Provider, MD  ondansetron (ZOFRAN-ODT) 4 MG disintegrating tablet Take 1-2 tablets by mouth every 8 (eight) hours as needed (for nausea).  04/17/15  Yes Historical Provider, MD  Polyethyl Glycol-Propyl Glycol (SYSTANE) 0.4-0.3 % GEL ophthalmic gel Place 1 application into both eyes at bedtime.   Yes Historical Provider, MD  Polyethyl Glycol-Propyl Glycol 0.4-0.3 % SOLN Apply 1-2 drops to eye 4 (four) times daily as needed (for dry eyes).   Yes Historical Provider, MD  polyethylene glycol (MIRALAX / GLYCOLAX) packet Take 17-34 g by mouth daily.   Yes Historical Provider, MD  tiZANidine (ZANAFLEX) 4 MG tablet Take 4-8 mg by mouth 3 (three) times daily as needed. For muscle spasm. 03/26/16  Yes Historical Provider, MD  docusate sodium (COLACE) 100 MG capsule Take 100 mg by mouth 3 (three) times daily as needed for mild constipation.    Historical Provider, MD  sennosides-docusate sodium (SENOKOT-S) 8.6-50 MG tablet Take 2-4 tablets by mouth daily as needed for constipation.     Historical Provider, MD   traZODone (DESYREL) 100 MG tablet Take 100 mg by mouth at bedtime.    Historical Provider, MD   Allergies  Allergen Reactions  . Nitrofuran Derivatives Hives  . Sulfa Antibiotics Hives  . Nickel Itching and Other (See Comments)    SKIN TURNS BLACK     Social History  Substance Use Topics  . Smoking status: Former Smoker    Packs/day: 1.50    Years: 25.00    Types: Cigarettes    Quit date: 07/13/1993  . Smokeless tobacco: Former Systems developer    Quit date: 07/01/1994  . Alcohol use No    History reviewed. No pertinent family history.   Review of Systems  Positive ROS: neg  All other systems have been reviewed and were otherwise negative with the exception of those mentioned in the HPI and as above.  Objective: Vital signs in last 24 hours: Temp:  [98.3 F (36.8 C)] 98.3 F (36.8 C) (10/20 0803) Pulse Rate:  [59] 59 (10/20 0803) Resp:  [18] 18 (10/20 0803) BP: (129-132)/(37-70) 132/70 (10/20 0804) SpO2:  [98 %] 98 % (10/20 0803) Weight:  [43.5 kg (96 lb)] 43.5 kg (96 lb) (10/20 0803)  General Appearance: Alert, cooperative, no distress, appears stated age Head: Normocephalic, without obvious abnormality, atraumatic Eyes: PERRL, conjunctiva/corneas clear, EOM's intact    Neck: Supple, symmetrical, trachea midline Back: Symmetric, no curvature, ROM normal, no CVA tenderness Lungs:  respirations unlabored Heart: Regular rate and rhythm Abdomen: Soft, non-tender Extremities: Extremities normal, atraumatic, no cyanosis or edema Pulses: 2+ and symmetric all extremities Skin: Skin color, texture, turgor normal, no rashes or lesions  NEUROLOGIC:   Mental status: Alert and oriented x4,  no aphasia, good attention span, fund of knowledge, and memory Motor Exam - grossly normal Sensory Exam - grossly normal Reflexes: 1+ Coordination - grossly normal Gait - grossly normal Balance - grossly normal Cranial Nerves: I: smell Not tested  II: visual acuity  OS: nl    OD: nl  II:  visual fields Full to confrontation  II: pupils Equal, round, reactive to light  III,VII: ptosis None  III,IV,VI: extraocular muscles  Full ROM  V: mastication Normal  V: facial light touch sensation  Normal  V,VII: corneal reflex  Present  VII: facial muscle function - upper  Normal  VII: facial muscle function - lower Normal  VIII: hearing Not tested  IX: soft palate elevation  Normal  IX,X: gag reflex Present  XI: trapezius strength  5/5  XI: sternocleidomastoid strength 5/5  XI: neck flexion strength  5/5  XII: tongue strength  Normal    Data Review Lab Results  Component Value Date   WBC 6.7 04/20/2016   HGB 10.6 (L) 04/20/2016   HCT 34.2 (L) 04/20/2016   MCV 92.9 04/20/2016   PLT 278 04/20/2016   Lab Results  Component Value Date   NA 144 07/02/2015   K 3.6 07/02/2015   CL 107 07/02/2015  CO2 30 07/02/2015   BUN 6 07/02/2015   CREATININE 0.71 07/02/2015   GLUCOSE 105 (H) 07/02/2015   Lab Results  Component Value Date   INR 0.88 07/02/2015    Assessment/Plan: Patient admitted for Spinal cord stimulator battery replacement as a planned stage procedure. We placed the permanent trial last week.. Patient has failed a reasonable attempt at conservative therapy.  I explained the condition and procedure to the patient and answered any questions.  Patient wishes to proceed with procedure as planned. Understands risks/ benefits and typical outcomes of procedure.   Nickolus Wadding S 05/01/2016 8:20 AM

## 2016-05-01 NOTE — Anesthesia Preprocedure Evaluation (Addendum)
Anesthesia Evaluation  Patient identified by MRN, date of birth, ID band Patient awake    Reviewed: Allergy & Precautions, NPO status , Patient's Chart, lab work & pertinent test results  Airway Mallampati: II  TM Distance: >3 FB Neck ROM: Full    Dental  (+) Teeth Intact, Dental Advisory Given   Pulmonary former smoker,    Pulmonary exam normal breath sounds clear to auscultation       Cardiovascular Exercise Tolerance: Good (-) hypertension(-) angina(-) CAD and (-) Past MI negative cardio ROS Normal cardiovascular exam Rhythm:Regular Rate:Normal     Neuro/Psych  Headaches, PSYCHIATRIC DISORDERS Depression    GI/Hepatic Neg liver ROS, hiatal hernia, GERD  Medicated,  Endo/Other  negative endocrine ROS  Renal/GU negative Renal ROS     Musculoskeletal  (+) Arthritis , Osteoarthritis,    Abdominal   Peds  Hematology negative hematology ROS (+)   Anesthesia Other Findings Day of surgery medications reviewed with the patient.  Breast ca s/p left breast lumpectomy  Reproductive/Obstetrics                             Anesthesia Physical  Anesthesia Plan  ASA: II  Anesthesia Plan: General   Post-op Pain Management:    Induction: Intravenous  Airway Management Planned: Oral ETT  Additional Equipment:   Intra-op Plan:   Post-operative Plan: Extubation in OR  Informed Consent: I have reviewed the patients History and Physical, chart, labs and discussed the procedure including the risks, benefits and alternatives for the proposed anesthesia with the patient or authorized representative who has indicated his/her understanding and acceptance.   Dental advisory given  Plan Discussed with: CRNA  Anesthesia Plan Comments: (Risks/benefits of general anesthesia discussed with patient including risk of damage to teeth, lips, gum, and tongue, nausea/vomiting, allergic reactions to  medications, and the possibility of heart attack, stroke and death.  All patient questions answered.  Patient wishes to proceed.)        Anesthesia Quick Evaluation

## 2016-05-01 NOTE — Discharge Summary (Signed)
Physician Discharge Summary  Patient ID: Catherine Davidson MRN: [id number] DOB/AGE: 1945-08-25 70 y.o.  Admit date: 05/01/2016 Discharge date: 05/01/2016  Admission Diagnoses:chronic pain  Discharge Diagnoses: same Active Problems:   S/P insertion of spinal cord stimulator   Discharged Condition: good  Hospital Course: [name] was admitted and had a spinal cord stimulator placed. At discharge her wound is clean, dry, and without signs of infection.   Treatments: surgery: as above  Discharge Exam: Blood pressure (!) 93/47, pulse (!) 59, temperature 98.7 F (37.1 C), temperature source Oral, resp. rate 16, weight 43.5 kg (96 lb), SpO2 97 %. General appearance: alert, cooperative, appears stated age and no distress  Disposition: 01-Home or Self Care POSTLAMINECTOMY SYNDROME    Medication List    TAKE these medications   atorvastatin 10 MG tablet Commonly known as:  LIPITOR Take 10 mg by mouth at bedtime.   b complex vitamins tablet Take 1 tablet by mouth daily.   docusate sodium 100 MG capsule Commonly known as:  COLACE Take 100 mg by mouth 3 (three) times daily as needed for mild constipation.   esomeprazole 40 MG capsule Commonly known as:  NEXIUM Take 40 mg by mouth 2 (two) times daily before a meal.   famotidine-calcium carbonate-magnesium hydroxide 10-800-165 MG chewable tablet Commonly known as:  PEPCID COMPLETE Chew 1 tablet by mouth 2 (two) times daily as needed (for heartburn/indigestion).   FLUoxetine 40 MG capsule Commonly known as:  PROZAC Take 40 mg by mouth every evening.   HYDROcodone-acetaminophen 10-325 MG tablet Commonly known as:  NORCO Take 2 tablets by mouth every 8 (eight) hours.   lidocaine 5 % Commonly known as:  LIDODERM Place 1 patch onto the skin daily as needed (for pain). Remove & Discard patch within 12 hours or as directed by MD   mirtazapine 7.5 MG tablet Commonly known as:  REMERON Take 7.5 mg by mouth every  evening.   ondansetron 4 MG disintegrating tablet Commonly known as:  ZOFRAN-ODT Take 1-2 tablets by mouth every 8 (eight) hours as needed (for nausea).   Polyethyl Glycol-Propyl Glycol 0.4-0.3 % Soln Apply 1-2 drops to eye 4 (four) times daily as needed (for dry eyes).   SYSTANE 0.4-0.3 % Gel ophthalmic gel Generic drug:  Polyethyl Glycol-Propyl Glycol Place 1 application into both eyes at bedtime.   polyethylene glycol packet Commonly known as:  MIRALAX / GLYCOLAX Take 17-34 g by mouth daily.   sennosides-docusate sodium 8.6-50 MG tablet Commonly known as:  SENOKOT-S Take 2-4 tablets by mouth daily as needed for constipation.   tiZANidine 4 MG tablet Commonly known as:  ZANAFLEX Take 4-8 mg by mouth 3 (three) times daily as needed. For muscle spasm.   traZODone 100 MG tablet Commonly known as:  DESYREL Take 100 mg by mouth at bedtime.   Vitamin D3 5000 units Tabs Take 5,000 Units by mouth daily.      Follow-up Information    JONES,DAVID S, MD. Go in 2 week(s).   Specialty:  Neurosurgery Why:  your appointment has been made Contact information: [address] [place] [address] [phone]           Signed: Winfield Cunas 05/01/2016, 5:43 PM

## 2016-05-01 NOTE — Op Note (Signed)
05/01/2016  10:01 AM  PATIENT:  Catherine Davidson  70 y.o. female  PRE-OPERATIVE DIAGNOSIS:  Chronic back pain status post spinal cord stimulator trial placement  POST-OPERATIVE DIAGNOSIS:  Same  PROCEDURE:  planned staged placement of spinal cord stimulator battery  SURGEON:  Sherley Bounds, MD  ASSISTANTS: None  ANESTHESIA:   General  EBL: 25 ml  Total I/O In: 500 [I.V.:500] Out: 25 [Blood:25]  BLOOD ADMINISTERED:none  DRAINS: none   SPECIMEN:  No Specimen  INDICATION FOR PROCEDURE: This patient presented for staged planned spinal cords to [facility] battery placement. Patient understood the risks, benefits, and alternatives and potential outcomes and wished to proceed.  PROCEDURE DETAILS: The patient was taken to the operating room and after induction of adequate generalized endotracheal anesthesia she was rolled into the prone position on chest rolls and all pressure points were padded. Her thoracic and lumbar region was cleaned with alcohol and then cleaned with Betadine scrub and then prepped with DuraPrep and then draped in usual sterile fashion. An incision was made in the right flank and a pocket was made for the battery. Her thoracic incision was opened and the tears were removed and cleaned with bacitracin-containing saline solution. We removed the extensions and cut the extensions so that they can be pulled distally out of the exit site. We then used a shunt passer to pass between the thoracic incision and the flank incision. We passed our leads. We then pulled the passer out. We then placed the leads into the battery and checked our impedance. We then blocked the leads into the battery. The battery was then placed into its right flank pocket and the pocket was then irrigated, vancomycin powder was placed in the subcutaneous used tissues were closed with 2-0 Vicryl in the subcuticular tissues were closed with 3-0 Vicryl. The thoracic incision was irrigated with saline solution  contained bacitracin. Powdered vancomycin was placed and then the subcutaneous used tissues were closed with 2-0 Vicryl and the subcuticular tissues were closed with 3-0 Vicryl. The skin was then closed with Dermabond. The drapes were removed. The patient was awakened and transferred to the recovery room in stable condition. At the end of the procedure all sponge needle and instrument counts were correct.  PLAN OF CARE: Admit for overnight observation  PATIENT DISPOSITION:  PACU - hemodynamically stable.   Delay start of Pharmacological VTE agent (>24hrs) due to surgical blood loss or risk of bleeding:  yes

## 2016-05-01 NOTE — Progress Notes (Signed)
Pt arrived to unit via stretcher.  Pt ambulated to bathroom then to bed.  Incisions clean dry and intact.  VSS.  In no apparent distress, no c/o pain at this time.  Will continue to monitor.  Cori Razor, RN

## 2016-05-01 NOTE — Discharge Instructions (Signed)
You may shower tomorrow. The incisions can get wet, there is no need to cover the incisions as they are covered with surgical glue Do not lift more than 5lbs You may take stairs, and be a passenger in a car. We do not want you to drive until you see Dr. Ronnald Ramp. Call the office if your wound starts to drain, becomes very red or tender, if your Temp >101.5

## 2016-05-01 NOTE — Anesthesia Procedure Notes (Signed)
Procedure Name: Intubation Date/Time: 05/01/2016 8:45 AM Performed by: Mariea Clonts Pre-anesthesia Checklist: Patient identified, Emergency Drugs available, Suction available and Patient being monitored Patient Re-evaluated:Patient Re-evaluated prior to inductionOxygen Delivery Method: Circle System Utilized Preoxygenation: Pre-oxygenation with 100% oxygen Intubation Type: IV induction Ventilation: Mask ventilation without difficulty Laryngoscope Size: Glidescope Grade View: Grade III Tube type: Oral Tube size: 6.5 mm Number of attempts: 1 Airway Equipment and Method: Stylet and Oral airway Placement Confirmation: ETT inserted through vocal cords under direct vision,  positive ETCO2 and breath sounds checked- equal and bilateral Tube secured with: Tape Dental Injury: Teeth and Oropharynx as per pre-operative assessment  Difficulty Due To: Difficulty was unanticipated and Difficult Airway- due to anterior larynx Future Recommendations: Recommend- induction with short-acting agent, and alternative techniques readily available

## 2016-05-02 ENCOUNTER — Encounter (HOSPITAL_COMMUNITY): Payer: Self-pay | Admitting: Neurological Surgery

## 2016-05-04 ENCOUNTER — Encounter (HOSPITAL_COMMUNITY): Payer: Self-pay | Admitting: Neurological Surgery

## 2016-05-04 DIAGNOSIS — G8929 Other chronic pain: Secondary | ICD-10-CM | POA: Diagnosis not present

## 2016-05-04 NOTE — Transfer of Care (Signed)
Immediate Anesthesia Transfer of Care Note  Patient: Catherine Davidson  Procedure(s) Performed: Procedure(s): LUMBAR SPINAL CORD STIMULATOR INSERTION (N/A)  Patient Location: PACU  Anesthesia Type:General  Level of Consciousness: awake, alert  and oriented  Airway & Oxygen Therapy: Patient Spontanous Breathing and Patient connected to nasal cannula oxygen  Post-op Assessment: Report given to RN, Post -op Vital signs reviewed and stable and Patient moving all extremities X 4  Post vital signs: Reviewed and stable  Last Vitals:  Vitals:   05/01/16 1142 05/01/16 1636  BP: 131/62 (!) 93/47  Pulse: 76 (!) 59  Resp: 16 16  Temp: 36.4 C 37.1 C    Last Pain:  Vitals:   05/01/16 1636  TempSrc: Oral  PainSc:       Patients Stated Pain Goal: 4 ([id number])  Complications: No apparent anesthesia complications

## 2016-05-05 NOTE — Anesthesia Postprocedure Evaluation (Signed)
Anesthesia Post Note  Patient: ROSALBA VENTRELLA  Procedure(s) Performed: Procedure(s) (LRB): LUMBAR SPINAL CORD STIMULATOR INSERTION (N/A)  Patient location during evaluation: PACU Anesthesia Type: General Level of consciousness: awake and alert Pain management: pain level controlled Vital Signs Assessment: post-procedure vital signs reviewed and stable Respiratory status: spontaneous breathing, nonlabored ventilation, respiratory function stable and patient connected to nasal cannula oxygen Cardiovascular status: blood pressure returned to baseline and stable Postop Assessment: no signs of nausea or vomiting Anesthetic complications: no    Last Vitals:  Vitals:   05/01/16 1142 05/01/16 1636  BP: 131/62 (!) 93/47  Pulse: 76 (!) 59  Resp: 16 16  Temp: 36.4 C 37.1 C    Last Pain:  Vitals:   05/01/16 1636  TempSrc: Oral  PainSc:                  Catalina Gravel

## 2016-11-04 IMAGING — CR DG CHEST 2V
2 series · 2 of 2 positions shown · non-contrast
Comparison: CT 05/27/2015 .

CLINICAL DATA: Herniated disc.

EXAM:
CHEST  2 VIEW

[w chest pa]
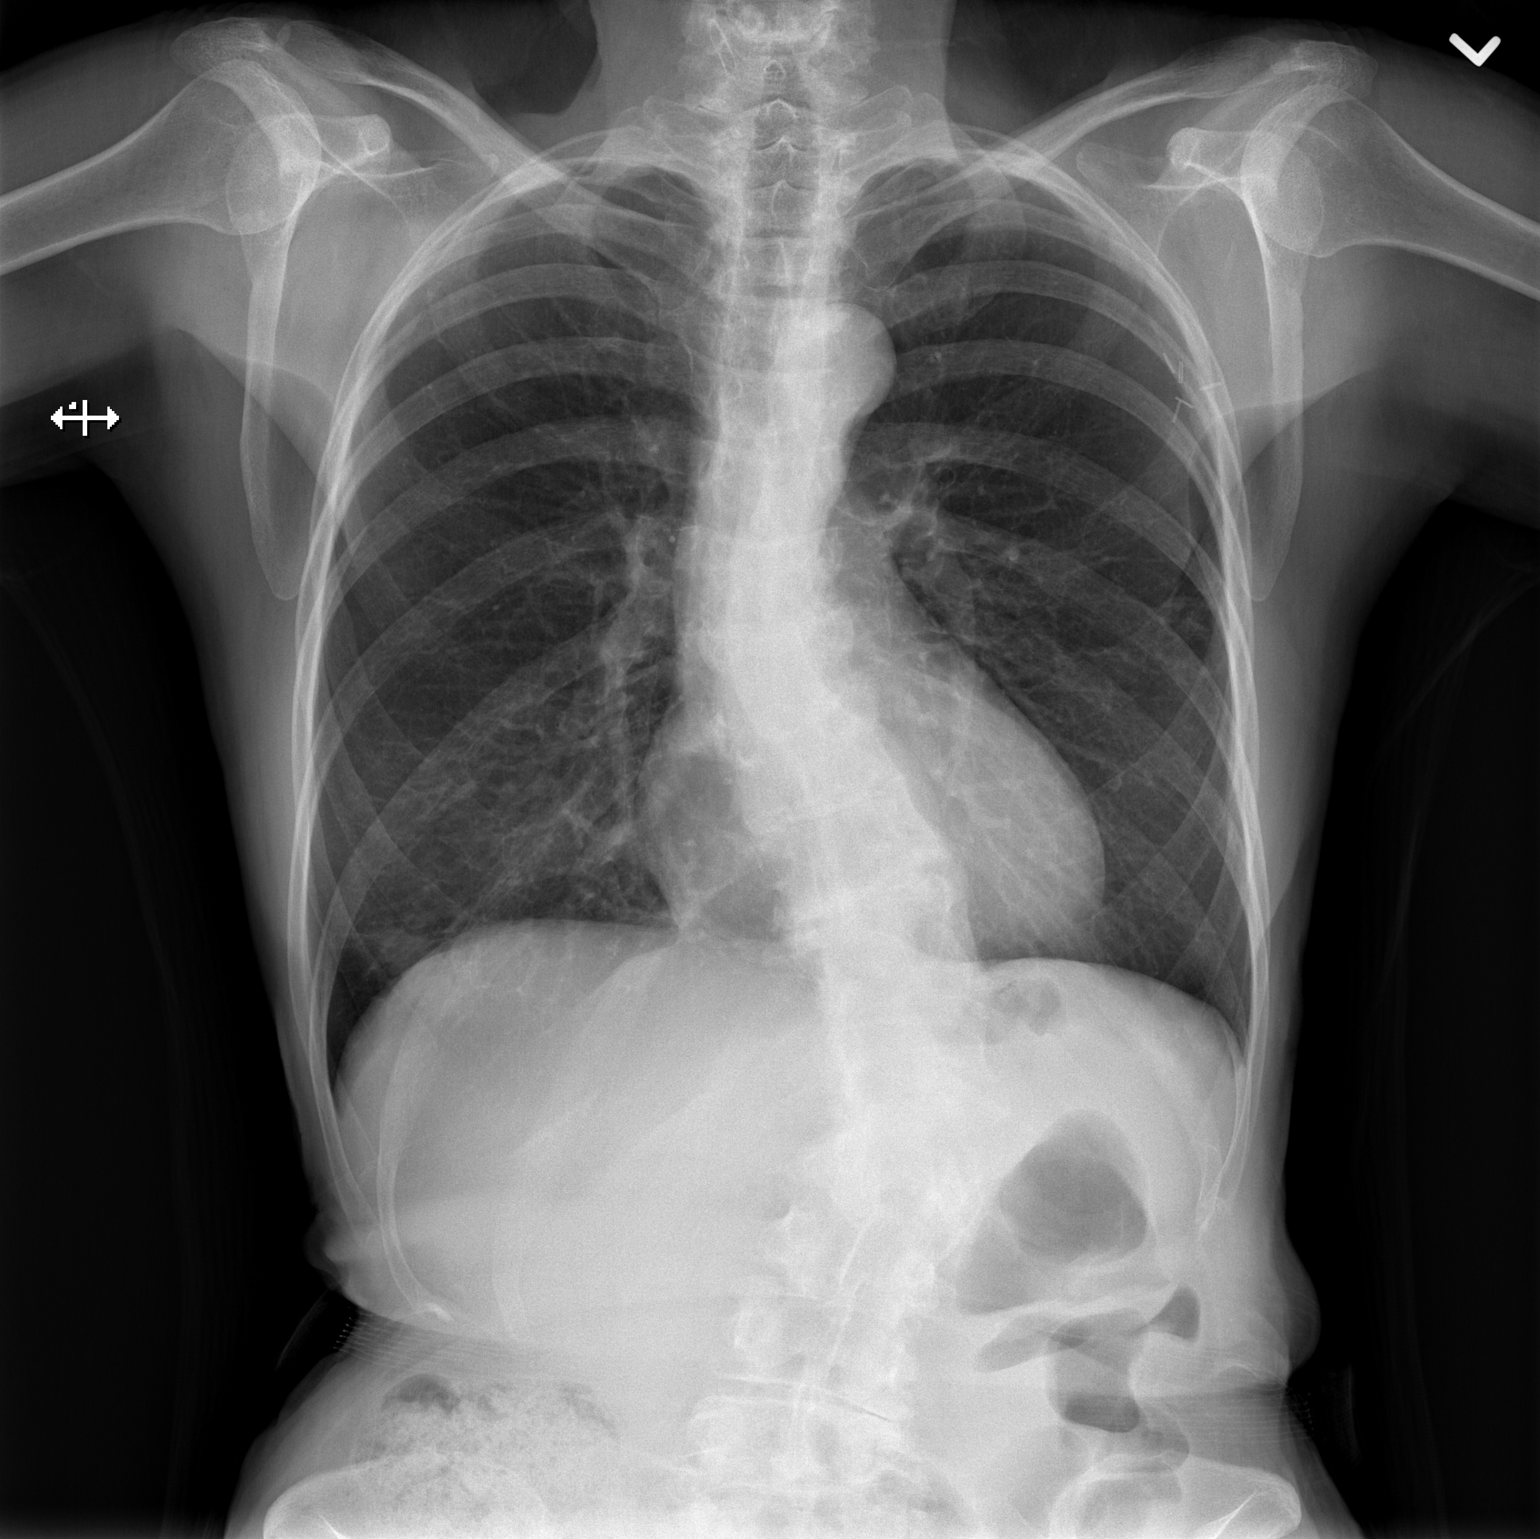

[w chest lat]
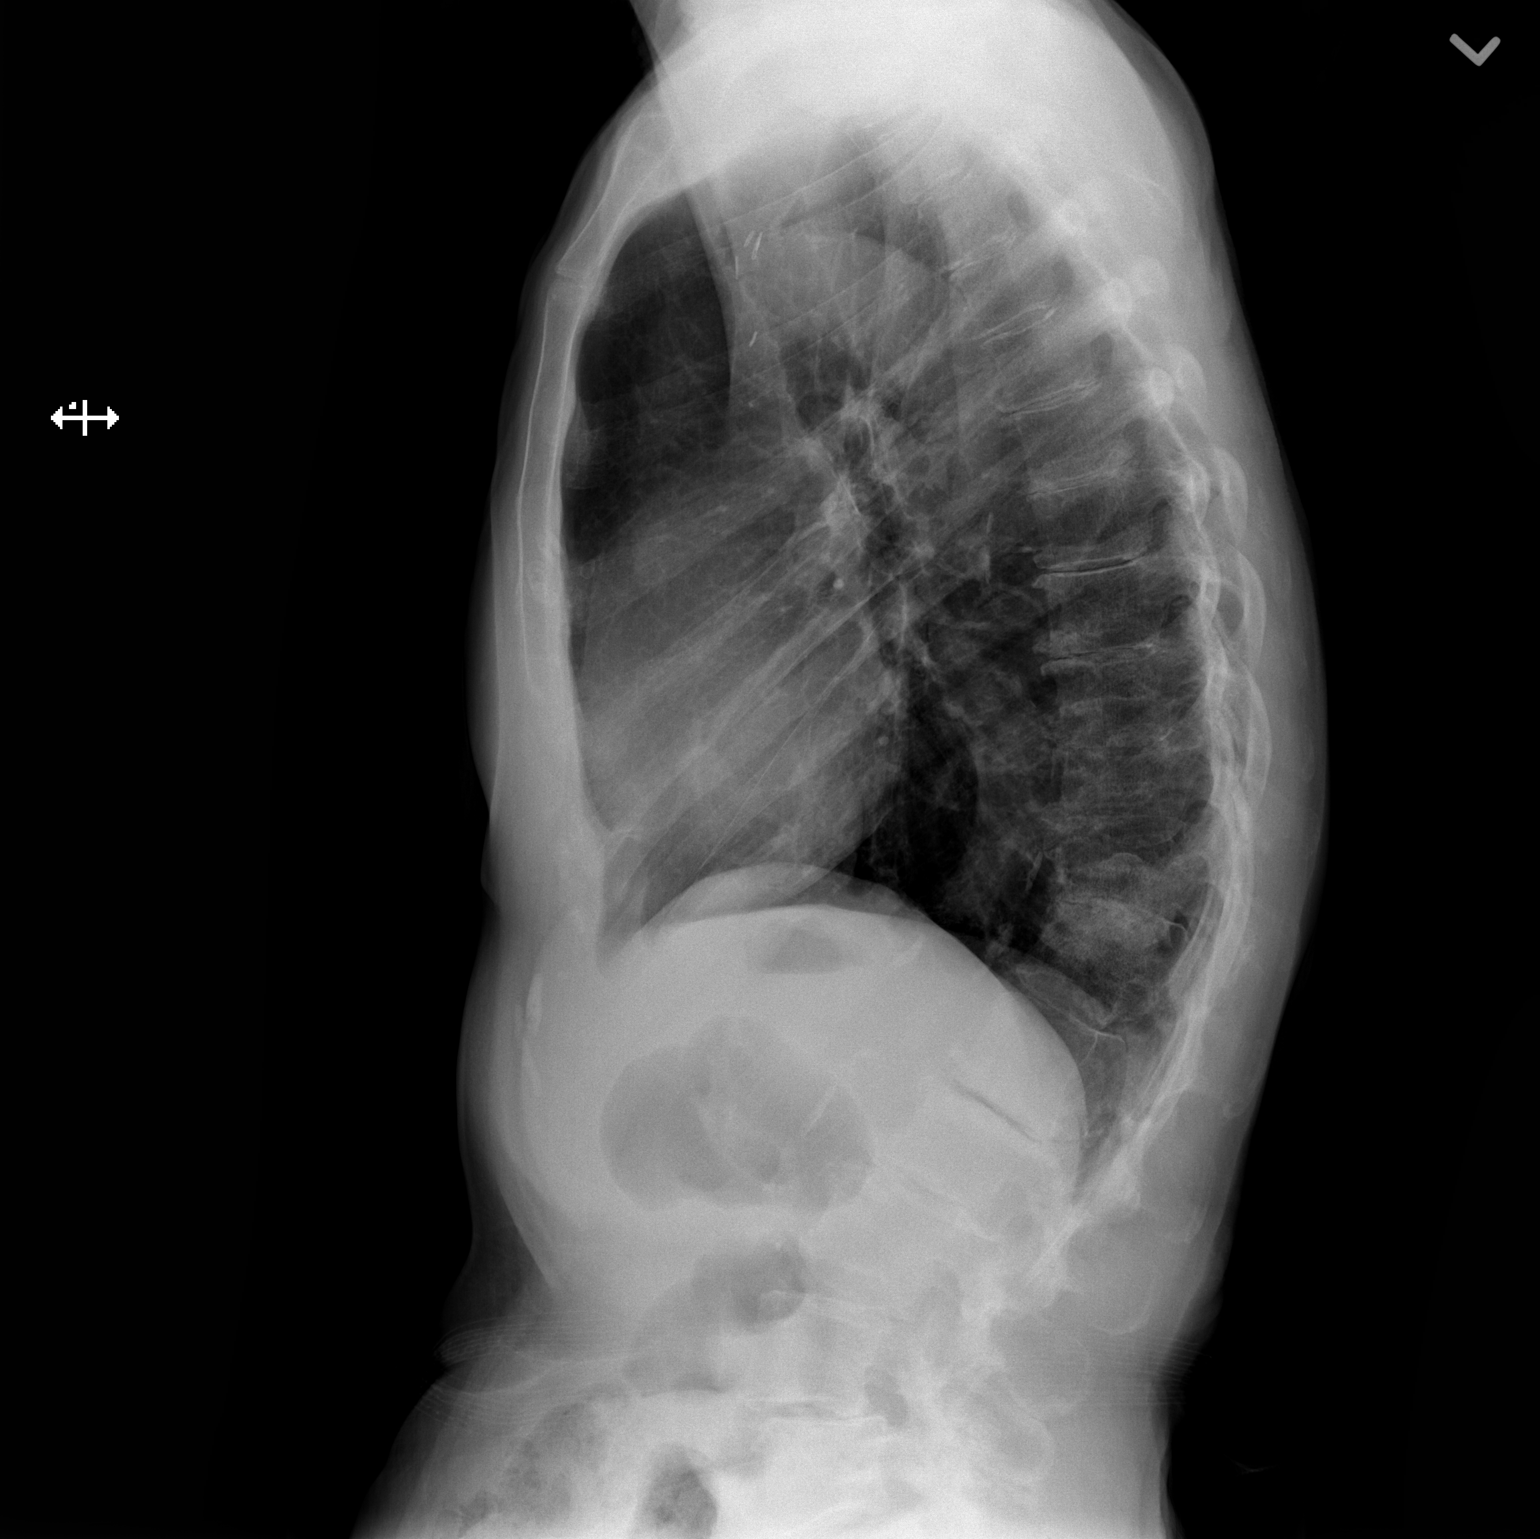

[2 of 2 positions shown; findings below may reference images not displayed]

FINDINGS: Mediastinum and hilar structures normal. Mild patchy pulmonary
infiltrates are again noted bilaterally. Pulmonary nodules best
demonstrated by prior CT . As noted on prior CT report close
follow-up evaluation with follow-up CTs should be considered. Heart
size normal. No pleural effusion or pneumothorax. Surgical clips
left axilla. Severe thoracolumbar scoliosis.
IMPRESSION: 1. Mild patchy bilateral pulmonary infiltrates again noted.
2. Previously identified tiny pulmonary nodules noted on prior CT of
05/27/2015 would be difficult to evaluate by plain film exam. Again
follow-up CT is suggested for continued evaluation is noted on prior
CT report of 05/27/2015 .

## 2016-11-07 IMAGING — CR DG LUMBAR SPINE 2-3V
1 series · 1 of 1 positions shown · non-contrast
Comparison: 05/24/2015

CLINICAL DATA: Localization film for L1-L2 laminectomy

EXAM:
LUMBAR SPINE - 2-3 VIEW

[lat]
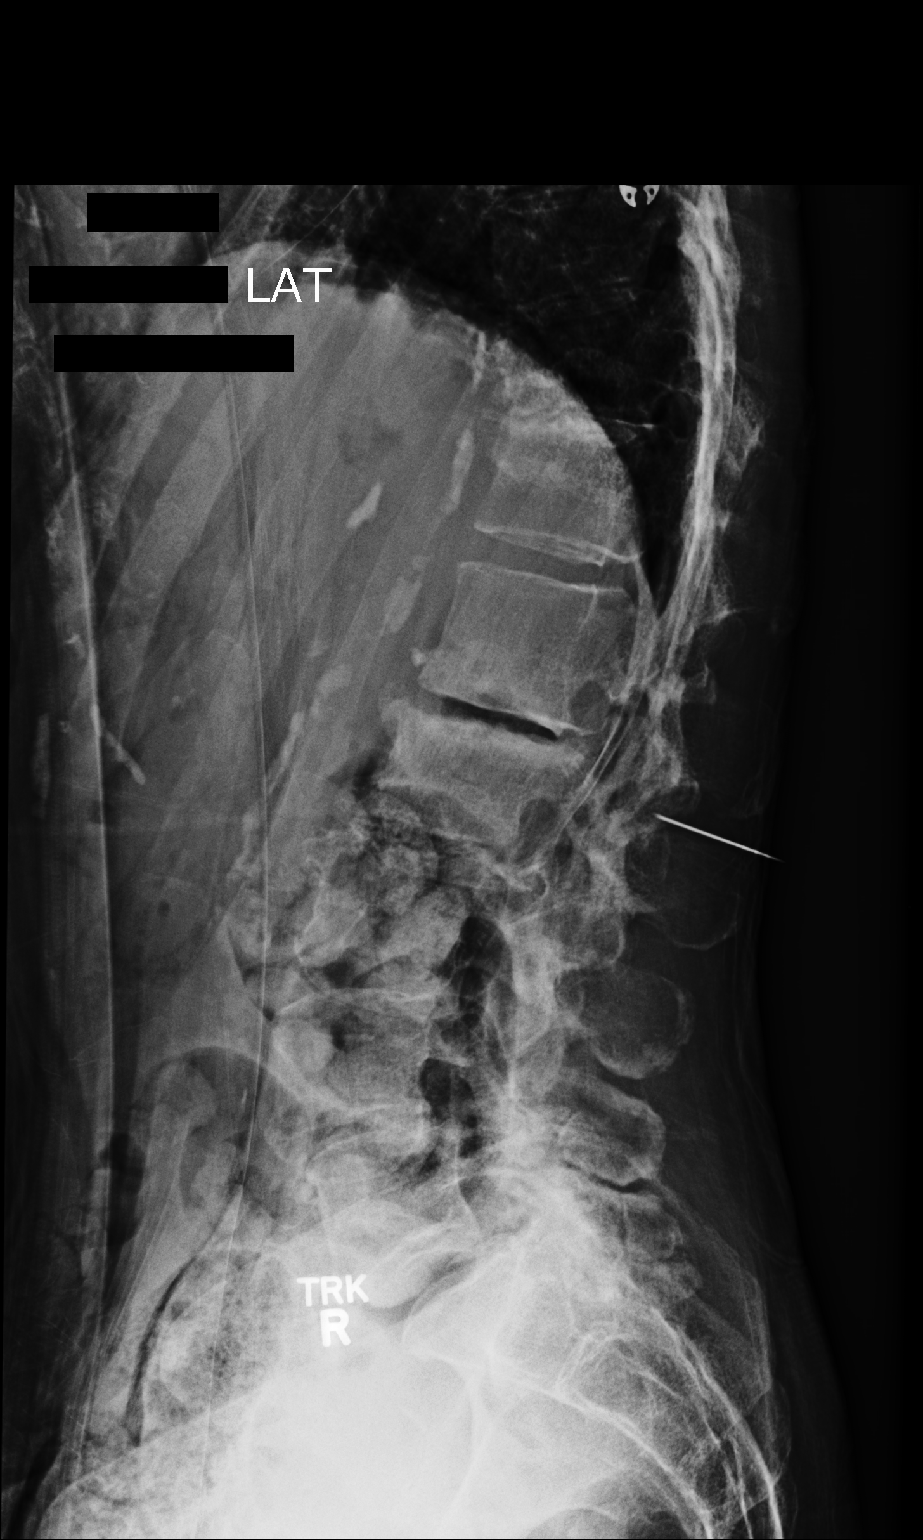

[1 of 1 positions shown; findings below may reference images not displayed]

FINDINGS: Three lateral views of the lumbar spine submitted. First film a
metallic localization instrument noted posteriorly at the level of
L1-L2 disc space mid aspect of spinous process of L1. On the second
film a posterior localization needle just above the Vermaas process of
L2. On the third film metallic localization instrument posteriorly
L1-L2 disc level just above the posterior aspect lower endplate of
L1.
IMPRESSION: On the third film metallic localization instrument posteriorly L1-L2
disc level just above the posterior aspect lower endplate of L1.

## 2017-08-28 IMAGING — RF DG C-ARM 61-120 MIN
1 series · 1 of 1 positions shown · non-contrast
Comparison: None.

FLUOROSCOPY TIME:  0 minutes 20 seconds; 1 acquired image

CLINICAL DATA: Thoracic stimulator placement

EXAM:
DG C-ARM 61-120 MIN; THORACIC SPINE - 1 VIEW

[Series 1: run · 1 of 1 slices shown]
[im 1/1]
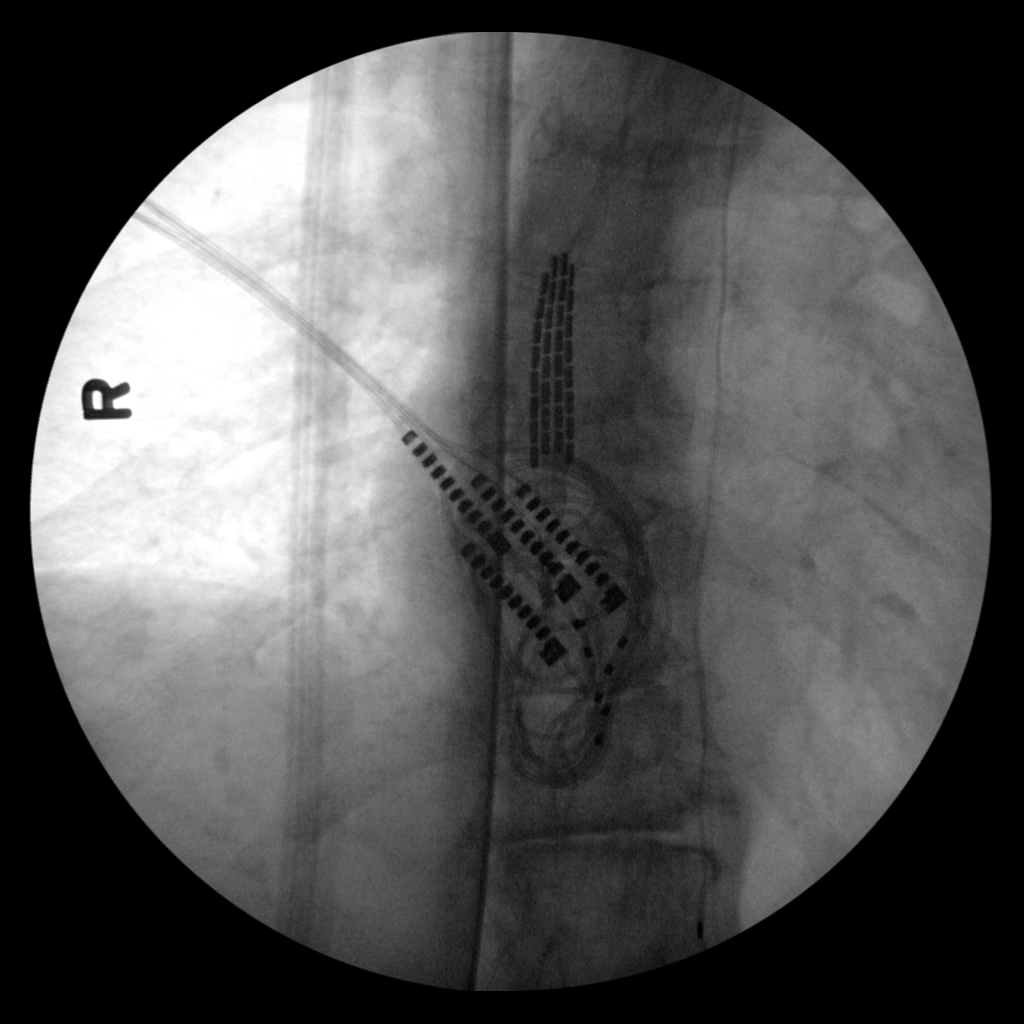

[1 of 1 positions shown; findings below may reference images not displayed]

FINDINGS: Frontal view acquired. There is a thoracic stimulator with lead tips
at the level of T8-9. Visualized bony structures appear normal.
IMPRESSION: Thoracic stimulator lead tips at level of T8-9. Visualized bony
structures appear normal on this single view.
